# Patient Record
Sex: Female | Born: 1990 | Race: Black or African American | Hispanic: No | Marital: Married | Smoking: Never smoker
Health system: Southern US, Community
[De-identification: ages and names within clinical notes are randomized; demographics above are authoritative.]

## PROBLEM LIST (undated history)

## (undated) ENCOUNTER — Inpatient Hospital Stay (HOSPITAL_COMMUNITY): Payer: Self-pay

## (undated) DIAGNOSIS — N809 Endometriosis, unspecified: Secondary | ICD-10-CM

## (undated) DIAGNOSIS — N390 Urinary tract infection, site not specified: Secondary | ICD-10-CM

## (undated) DIAGNOSIS — O24419 Gestational diabetes mellitus in pregnancy, unspecified control: Secondary | ICD-10-CM

## (undated) DIAGNOSIS — A749 Chlamydial infection, unspecified: Secondary | ICD-10-CM

## (undated) DIAGNOSIS — N83209 Unspecified ovarian cyst, unspecified side: Secondary | ICD-10-CM

## (undated) DIAGNOSIS — Z9889 Other specified postprocedural states: Secondary | ICD-10-CM

## (undated) HISTORY — PX: ADENOIDECTOMY: SUR15

## (undated) HISTORY — PX: TONSILLECTOMY: SUR1361

---

## 2011-01-06 HISTORY — PX: ABDOMINAL EXPLORATION SURGERY: SHX538

## 2012-04-07 ENCOUNTER — Emergency Department (HOSPITAL_COMMUNITY): Payer: Medicaid Other

## 2012-04-07 ENCOUNTER — Other Ambulatory Visit: Payer: Self-pay

## 2012-04-07 ENCOUNTER — Emergency Department (HOSPITAL_COMMUNITY)
Admission: EM | Admit: 2012-04-07 | Discharge: 2012-04-07 | Disposition: A | Discharge status: Home / Self Care | Payer: Medicaid Other | Attending: Emergency Medicine | Admitting: Emergency Medicine

## 2012-04-07 ENCOUNTER — Encounter (HOSPITAL_COMMUNITY): Payer: Self-pay | Admitting: Cardiology

## 2012-04-07 DIAGNOSIS — F419 Anxiety disorder, unspecified: Secondary | ICD-10-CM

## 2012-04-07 DIAGNOSIS — R079 Chest pain, unspecified: Secondary | ICD-10-CM

## 2012-04-07 DIAGNOSIS — R0602 Shortness of breath: Secondary | ICD-10-CM | POA: Insufficient documentation

## 2012-04-07 DIAGNOSIS — F411 Generalized anxiety disorder: Secondary | ICD-10-CM | POA: Insufficient documentation

## 2012-04-07 DIAGNOSIS — Z3202 Encounter for pregnancy test, result negative: Secondary | ICD-10-CM | POA: Insufficient documentation

## 2012-04-07 DIAGNOSIS — R0789 Other chest pain: Secondary | ICD-10-CM | POA: Insufficient documentation

## 2012-04-07 LAB — RAPID URINE DRUG SCREEN, HOSP PERFORMED
Barbiturates: NOT DETECTED
Opiates: NOT DETECTED
Tetrahydrocannabinol: NOT DETECTED

## 2012-04-07 LAB — CBC WITH DIFFERENTIAL/PLATELET
Basophils Relative: 0 % (ref 0–1)
Eosinophils Absolute: 0.2 10*3/uL (ref 0.0–0.7)
Eosinophils Relative: 2 % (ref 0–5)
Lymphs Abs: 1.6 10*3/uL (ref 0.7–4.0)
MCH: 26.6 pg (ref 26.0–34.0)
MCHC: 34.2 g/dL (ref 30.0–36.0)
MCV: 77.8 fL — ABNORMAL LOW (ref 78.0–100.0)
Platelets: 311 10*3/uL (ref 150–400)
RBC: 5.08 MIL/uL (ref 3.87–5.11)
RDW: 14.4 % (ref 11.5–15.5)

## 2012-04-07 LAB — BASIC METABOLIC PANEL
Calcium: 9.5 mg/dL (ref 8.4–10.5)
GFR calc non Af Amer: >90 mL/min (ref >90)
Glucose, Bld: 86 mg/dL (ref 70–99)
Sodium: 135 mEq/L (ref 135–145)

## 2012-04-07 MED ORDER — HYDROXYZINE HCL 25 MG PO TABS: 25.0000 mg | ORAL_TABLET | Freq: Four times a day (QID) | ORAL | Status: DC | PRN

## 2012-04-07 NOTE — ED Provider Notes (Signed)
History     CSN: [id number]  Arrival date & time 04/07/12  1239   First MD Initiated Contact with Patient 04/07/12 1323      Chief Complaint  Patient presents with  . Chest Pain    (Consider location/radiation/quality/duration/timing/severity/associated sxs/prior treatment) HPI Comments: Patient comes to the ER for evaluation of chest pain. Patient reports that when she woke this morning she was experiencing tightness and squeezing in her chest. She reports that she has intermittent episodes of increasing pain that lasts for about a minute and there is back to the dural aching squeezing. She has not had any relief of the pain since she woke this morning. She feels short of breath. Pain worsens when she breathes. She has not injured herself in any way. No fever, nausea, vomiting or diarrhea. She has not had a cough.   History reviewed. No pertinent past medical history.  History reviewed. No pertinent past surgical history.  History reviewed. No pertinent family history.  History  Substance Use Topics  . Smoking status: Not on file  . Smokeless tobacco: Not on file  . Alcohol Use: Not on file    OB History   Grav Para Term Preterm Abortions TAB SAB Ect Mult Living                  Review of Systems  Constitutional: Negative for fever.  Respiratory: Positive for chest tightness and shortness of breath. Negative for cough.   Cardiovascular: Positive for chest pain.  All other systems reviewed and are negative.    Allergies  Doxycycline  Home Medications  No current outpatient prescriptions on file.  BP 119/69  Pulse 105  Temp(Src) 97.9 F (36.6 C) (Oral)  Resp 18  SpO2 99%  LMP 11/08/2011  Physical Exam  Constitutional: She is oriented to person, place, and time. She appears well-developed and well-nourished. She appears distressed.  tearful  HENT:  Head: Normocephalic and atraumatic.  Right Ear: Hearing normal.  Nose: Nose normal.  Mouth/Throat:  Oropharynx is clear and moist and mucous membranes are normal.  Eyes: Conjunctivae and EOM are normal. Pupils are equal, round, and reactive to light.  Neck: Normal range of motion. Neck supple.  Cardiovascular: Normal rate, regular rhythm, S1 normal and S2 normal.  Exam reveals no gallop and no friction rub.   No murmur heard. Pulmonary/Chest: Effort normal and breath sounds normal. No respiratory distress. She exhibits no tenderness.  Abdominal: Soft. Normal appearance and bowel sounds are normal. There is no hepatosplenomegaly. There is no tenderness. There is no rebound, no guarding, no tenderness at McBurney's point and negative Murphy's sign. No hernia.  Musculoskeletal: Normal range of motion.  Neurological: She is alert and oriented to person, place, and time. She has normal strength. No cranial nerve deficit or sensory deficit. Coordination normal. GCS eye subscore is 4. GCS verbal subscore is 5. GCS motor subscore is 6.  Skin: Skin is warm, dry and intact. No rash noted. No cyanosis.  Psychiatric: Her speech is normal and behavior is normal. Thought content normal. Her mood appears anxious.  tearful    ED Course  Procedures (including critical care time)  EKG:  Date: 04/07/2012  Rate: 105  Rhythm: sinus tachycardia  QRS Axis: normal  Intervals: normal  ST/T Wave abnormalities: normal  Conduction Disutrbances:none  Narrative Interpretation:   Old EKG Reviewed: none available    Labs Reviewed  CBC WITH DIFFERENTIAL - Abnormal; Notable for the following:    MCV 77.8 (*)  All other components within normal limits  TROPONIN I  BASIC METABOLIC PANEL  D-DIMER, QUANTITATIVE  URINE RAPID DRUG SCREEN (HOSP PERFORMED)  POCT PREGNANCY, URINE   Dg Chest 2 View  04/07/2012  *RADIOLOGY REPORT*  Clinical Data: Chest pain short of breath  CHEST - 2 VIEW  Comparison:  None.  Findings:  The heart size and mediastinal contours are within normal limits.  Both lungs are clear.  The  visualized skeletal structures are unremarkable.  IMPRESSION: No active cardiopulmonary disease.   Original Report Authenticated By: Janeece Riggers, M.D.      Diagnoses: 1. Chest pain, atypical 2. Anxiety    MDM  Patient comes to the ER for evaluation of chest pain. She reports that the pain was present upon awakening this morning. She is squeezing pain that intermittently worsens but is not related to exertion. She does not had any cardiac risk factors. Patient is very tearful at arrival, has an element of anxiety present. An EKG was performed and her troponin was negative. I do not suspect acute coronary syndrome or other cardiac etiology. Her d-dimer was also normal. Patient is now feeling better without any intervention. She will be discharged, treat for musculoskeletal chest pain, followup as needed.        Gilda Crease, MD 04/07/12 [phone]

## 2012-04-07 NOTE — ED Notes (Signed)
Pt reports a squeezing sensation in her chest that started this morning. Pt reports SOB when the pain is present. Denies any n/v with the pain. Pt is tearful at triage.

## 2012-05-10 ENCOUNTER — Encounter (HOSPITAL_COMMUNITY): Payer: Self-pay | Admitting: Emergency Medicine

## 2012-05-10 ENCOUNTER — Emergency Department (HOSPITAL_COMMUNITY)
Admission: EM | Admit: 2012-05-10 | Discharge: 2012-05-10 | Disposition: A | Discharge status: Home / Self Care | Payer: Medicaid Other | Attending: Emergency Medicine | Admitting: Emergency Medicine

## 2012-05-10 ENCOUNTER — Emergency Department (HOSPITAL_COMMUNITY): Payer: Medicaid Other

## 2012-05-10 DIAGNOSIS — Z79899 Other long term (current) drug therapy: Secondary | ICD-10-CM | POA: Insufficient documentation

## 2012-05-10 DIAGNOSIS — N898 Other specified noninflammatory disorders of vagina: Secondary | ICD-10-CM | POA: Insufficient documentation

## 2012-05-10 DIAGNOSIS — R000 Tachycardia, unspecified: Secondary | ICD-10-CM | POA: Insufficient documentation

## 2012-05-10 DIAGNOSIS — Z8742 Personal history of other diseases of the female genital tract: Secondary | ICD-10-CM | POA: Insufficient documentation

## 2012-05-10 DIAGNOSIS — R42 Dizziness and giddiness: Secondary | ICD-10-CM | POA: Insufficient documentation

## 2012-05-10 DIAGNOSIS — R0602 Shortness of breath: Secondary | ICD-10-CM | POA: Insufficient documentation

## 2012-05-10 DIAGNOSIS — R0609 Other forms of dyspnea: Secondary | ICD-10-CM | POA: Insufficient documentation

## 2012-05-10 DIAGNOSIS — R0989 Other specified symptoms and signs involving the circulatory and respiratory systems: Secondary | ICD-10-CM | POA: Insufficient documentation

## 2012-05-10 DIAGNOSIS — R079 Chest pain, unspecified: Secondary | ICD-10-CM

## 2012-05-10 DIAGNOSIS — Z3202 Encounter for pregnancy test, result negative: Secondary | ICD-10-CM | POA: Insufficient documentation

## 2012-05-10 DIAGNOSIS — R0789 Other chest pain: Secondary | ICD-10-CM | POA: Insufficient documentation

## 2012-05-10 HISTORY — DX: Endometriosis, unspecified: N80.9

## 2012-05-10 HISTORY — DX: Other specified postprocedural states: Z98.890

## 2012-05-10 HISTORY — DX: Unspecified ovarian cyst, unspecified side: N83.209

## 2012-05-10 LAB — CBC WITH DIFFERENTIAL/PLATELET
Lymphocytes Relative: 33 % (ref 12–46)
Lymphs Abs: 2.2 10*3/uL (ref 0.7–4.0)
MCV: 79 fL (ref 78.0–100.0)
Neutrophils Relative %: 57 % (ref 43–77)
Platelets: 368 10*3/uL (ref 150–400)
RBC: 5.01 MIL/uL (ref 3.87–5.11)
WBC: 6.6 10*3/uL (ref 4.0–10.5)

## 2012-05-10 LAB — URINALYSIS, ROUTINE W REFLEX MICROSCOPIC
Glucose, UA: NEGATIVE mg/dL
Leukocytes, UA: NEGATIVE
pH: 6 (ref 5.0–8.0)

## 2012-05-10 LAB — POCT PREGNANCY, URINE: Preg Test, Ur: NEGATIVE

## 2012-05-10 MED ORDER — ONDANSETRON HCL 4 MG/2ML IJ SOLN
INTRAMUSCULAR | Status: AC
  Filled 2012-05-10: qty 2

## 2012-05-10 MED ORDER — ONDANSETRON 4 MG PO TBDP: 4.0000 mg | ORAL_TABLET | Freq: Three times a day (TID) | ORAL | Status: DC | PRN

## 2012-05-10 MED ORDER — LORAZEPAM 2 MG/ML IJ SOLN
1.0000 mg | Freq: Once | INTRAMUSCULAR | Status: AC
  Administered 2012-05-10: 1 mg via INTRAVENOUS
  Filled 2012-05-10: qty 1

## 2012-05-10 MED ORDER — SODIUM CHLORIDE 0.9 % IV BOLUS (SEPSIS)
1000.0000 mL | Freq: Once | INTRAVENOUS | Status: AC
  Administered 2012-05-10: 1000 mL via INTRAVENOUS

## 2012-05-10 MED ORDER — HYDROCODONE-ACETAMINOPHEN 5-325 MG PO TABS: 1.0000 | ORAL_TABLET | Freq: Three times a day (TID) | ORAL | Status: DC | PRN

## 2012-05-10 MED ORDER — HYDROCODONE-ACETAMINOPHEN 5-325 MG PO TABS
1.0000 | ORAL_TABLET | Freq: Once | ORAL | Status: AC
  Administered 2012-05-10: 1 via ORAL
  Filled 2012-05-10: qty 1

## 2012-05-10 MED ORDER — KETOROLAC TROMETHAMINE 30 MG/ML IJ SOLN
30.0000 mg | Freq: Once | INTRAMUSCULAR | Status: AC
  Administered 2012-05-10: 30 mg via INTRAVENOUS
  Filled 2012-05-10: qty 1

## 2012-05-10 MED ORDER — ONDANSETRON HCL 4 MG/2ML IJ SOLN
4.0000 mg | Freq: Once | INTRAMUSCULAR | Status: AC
  Administered 2012-05-10: 4 mg via INTRAVENOUS

## 2012-05-10 NOTE — ED Notes (Signed)
Patient still reports chest pain that is making her unable to relax.  Also having nausea.  Dr. Jeraldine Loots notified.

## 2012-05-10 NOTE — ED Notes (Signed)
Patient with chest pain she rates as an 8, describes pain as an ache and pressure.  Associated symptom is dizziness.  Patient denies, fever, chills, coughing, nausea, vomiting, or weakness.  Pain radiates to patients back and makes it difficult for her to breathe.  Patient reports being seen here about a month ago for same symptoms and was diagnoed with panic attacks and anxiety.  Patient is tearful.  No history of respiratory or cardiac disorders.

## 2012-05-10 NOTE — ED Notes (Signed)
Notified pt of pending ua.  Pt asked for tampon.  Explained to pt that the hospital did not carry those.  Pt stated that after she found someone with one she would then provide a ua.

## 2012-05-10 NOTE — ED Provider Notes (Signed)
History     CSN: [id number]  Arrival date & time 05/10/12  4098   First MD Initiated Contact with Patient 05/10/12 1940      Chief Complaint  Patient presents with  . Chest Pain     HPI  The patient presents with concern chest pain, dyspnea.  Symptoms began today, upon awakening.  Since onset the pain anteriorly, nonradiating, sharp with associated dyspnea, dizziness. She notes that she went to bed last night in her usual state of affairs, but does endorse significant distress. The patient was seen here one month ago for similar complaints. Since that evaluation she has been generally well. Today, no clear alleviating or exacerbating factors, though symptoms are very uncomfortable appearing.  Past Medical History  Diagnosis Date  . H/O abdominal surgery   . Endometriosis   . Other and unspecified ovarian cysts     Past Surgical History  Procedure Laterality Date  . Tonsillectomy      No family history on file.  History  Substance Use Topics  . Smoking status: Never Smoker   . Smokeless tobacco: Not on file  . Alcohol Use: No    OB History   Grav Para Term Preterm Abortions TAB SAB Ect Mult Living                  Review of Systems  Constitutional: Negative for fever and chills.  HENT: Negative.   Respiratory: Positive for chest tightness and shortness of breath. Negative for cough and wheezing.   Cardiovascular: Positive for chest pain. Negative for palpitations and leg swelling.  Gastrointestinal: Negative for abdominal pain and abdominal distention.  Genitourinary: Positive for vaginal bleeding. Negative for dysuria, flank pain, vaginal discharge, vaginal pain and pelvic pain.  Musculoskeletal: Negative for back pain.  Hematological: Does not bruise/bleed easily.    Allergies  Doxycycline  Home Medications   Current Outpatient Rx  Name  Route  Sig  Dispense  Refill  . acetaminophen (TYLENOL) 500 MG tablet   Oral   Take 500 mg by mouth every 6  (six) hours as needed for pain.         . cholecalciferol (VITAMIN D) 1000 UNITS tablet   Oral   Take 1,000 Units by mouth daily.         Marland Kitchen HYDROcodone-acetaminophen (VICODIN) 5-500 MG per tablet   Oral   Take 1 tablet by mouth every 6 (six) hours as needed for pain.         Marland Kitchen estradiol cypionate (DEPO-ESTRADIOL) 5 MG/ML injection   Intramuscular   Inject into the muscle every 3 (three) months.           BP 125/73  Pulse 111  Temp(Src) 99.2 F (37.3 C) (Oral)  Resp 16  Wt 115 lb (52.164 kg)  SpO2 100%  LMP 05/01/2012  Physical Exam  Constitutional: She is oriented to person, place, and time. She appears well-developed and well-nourished. She appears distressed.  tearful  HENT:  Head: Normocephalic and atraumatic.  Right Ear: Hearing normal.  Nose: Nose normal.  Mouth/Throat: Mucous membranes are normal.  Eyes: Conjunctivae and EOM are normal. Right eye exhibits no discharge. Left eye exhibits no discharge. No scleral icterus.  Neck: No tracheal deviation present.  Cardiovascular: Regular rhythm, S1 normal and S2 normal.  Tachycardia present.  Exam reveals no gallop and no friction rub.   No murmur heard. Pulmonary/Chest: Effort normal and breath sounds normal. No stridor. No respiratory distress. She exhibits tenderness.  Abdominal: Soft. Normal appearance. There is no hepatosplenomegaly. There is no tenderness. There is no rebound, no guarding, no tenderness at McBurney's point and negative Murphy's sign. No hernia.  Musculoskeletal: Normal range of motion.  Neurological: She is alert and oriented to person, place, and time. She has normal strength. No cranial nerve deficit or sensory deficit. Coordination normal. GCS eye subscore is 4. GCS verbal subscore is 5. GCS motor subscore is 6.  Skin: Skin is warm, dry and intact. No rash noted. No cyanosis.  Psychiatric: Her speech is normal and behavior is normal. Thought content normal. Her mood appears anxious.    tearful    ED Course  Procedures (including critical care time)  Labs Reviewed  CBC WITH DIFFERENTIAL  URINALYSIS, ROUTINE W REFLEX MICROSCOPIC   No results found.   No diagnosis found.  Pulse ox 99% room air normal Cardiac 110 sinus tach abnormal    Date: 05/10/2012  Rate: 118  Rhythm: sinus tachycardia  QRS Axis: normal  Intervals: normal  ST/T Wave abnormalities: normal  Conduction Disutrbances:none  Narrative Interpretation:   Old EKG Reviewed: none available ABNORMAL  A review of the visit one month ago demonstrates a very similar presentation, though without ROS positive for vaginal bleeding.  W/U a that time included las, dimer (all reassuring.)  Update:Patient had what appeared to be a significantly stressful verbal altercation with a man.  10:28 PM Labs unremarkable.     MDM  This young female presents with chest pain.  She has no risk factors for ACS, PE.  Additionally, the patient was seen here one month ago for similar complaints, with an unremarkable evaluation. Socially, notably, the patient is getting married in 2 weeks.  During her emergency room course tonight she had a verbal altercation with another individual. The patient's ECG, labs, vital signs are all unremarkable, and the tenderness to palpation about the anterior chest is suggestive of musculoskeletal etiology.  Absent any risk factors, evidence of distress, recurrence, she is appropriate for discharge with outpatient management from this episode of chest pain likely due to inflammation, though with consideration of stress as contributory.        Gerhard Munch, MD 05/10/12 2229

## 2012-06-10 DIAGNOSIS — Z79899 Other long term (current) drug therapy: Secondary | ICD-10-CM | POA: Insufficient documentation

## 2012-06-10 DIAGNOSIS — R109 Unspecified abdominal pain: Secondary | ICD-10-CM | POA: Insufficient documentation

## 2012-06-10 DIAGNOSIS — R112 Nausea with vomiting, unspecified: Secondary | ICD-10-CM | POA: Insufficient documentation

## 2012-06-10 DIAGNOSIS — Z3202 Encounter for pregnancy test, result negative: Secondary | ICD-10-CM | POA: Insufficient documentation

## 2012-06-10 DIAGNOSIS — Z8742 Personal history of other diseases of the female genital tract: Secondary | ICD-10-CM | POA: Insufficient documentation

## 2012-06-11 ENCOUNTER — Emergency Department (HOSPITAL_BASED_OUTPATIENT_CLINIC_OR_DEPARTMENT_OTHER)
Admission: EM | Admit: 2012-06-11 | Discharge: 2012-06-11 | Disposition: A | Discharge status: Home / Self Care | Payer: Medicaid Other | Attending: Emergency Medicine | Admitting: Emergency Medicine

## 2012-06-11 DIAGNOSIS — R112 Nausea with vomiting, unspecified: Secondary | ICD-10-CM

## 2012-06-11 LAB — WET PREP, GENITAL
Clue Cells Wet Prep HPF POC: NONE SEEN
Trich, Wet Prep: NONE SEEN

## 2012-06-11 LAB — URINALYSIS, ROUTINE W REFLEX MICROSCOPIC
Bilirubin Urine: NEGATIVE
Hgb urine dipstick: NEGATIVE
Protein, ur: NEGATIVE mg/dL
Specific Gravity, Urine: 1.028 (ref 1.005–1.030)
Urobilinogen, UA: 1 mg/dL (ref 0.0–1.0)

## 2012-06-11 MED ORDER — ONDANSETRON 4 MG PO TBDP
4.0000 mg | ORAL_TABLET | Freq: Once | ORAL | Status: AC
  Administered 2012-06-11: 4 mg via ORAL
  Filled 2012-06-11: qty 1

## 2012-06-11 MED ORDER — ONDANSETRON 8 MG PO TBDP: 8.0000 mg | ORAL_TABLET | Freq: Three times a day (TID) | ORAL | Status: DC | PRN

## 2012-06-11 NOTE — ED Provider Notes (Addendum)
History     CSN: [id number]  Arrival date & time 06/10/12  2353   First MD Initiated Contact with Patient 06/11/12 0032      Chief Complaint  Patient presents with  . Nausea and Vomiting     (Consider location/radiation/quality/duration/timing/severity/associated sxs/prior treatment) HPI This is a 22 year old female with a one-week history of nausea and vomiting. The symptoms are moderate. She has not taken any medications to treat this. This has been accompanied by hot flashes and lower abdominal cramping but no diarrhea or vaginal discharge. She states she is about a week late for her period but has had spotting.  Past Medical History  Diagnosis Date  . H/O abdominal surgery   . Endometriosis   . Other and unspecified ovarian cysts     Past Surgical History  Procedure Laterality Date  . Tonsillectomy      No family history on file.  History  Substance Use Topics  . Smoking status: Never Smoker   . Smokeless tobacco: Not on file  . Alcohol Use: No    OB History   Grav Para Term Preterm Abortions TAB SAB Ect Mult Living                  Review of Systems  All other systems reviewed and are negative.    Allergies  Doxycycline  Home Medications   Current Outpatient Rx  Name  Route  Sig  Dispense  Refill  . acetaminophen (TYLENOL) 500 MG tablet   Oral   Take 500 mg by mouth every 6 (six) hours as needed for pain.         . cholecalciferol (VITAMIN D) 1000 UNITS tablet   Oral   Take 1,000 Units by mouth daily.         Marland Kitchen estradiol cypionate (DEPO-ESTRADIOL) 5 MG/ML injection   Intramuscular   Inject into the muscle every 3 (three) months.         Marland Kitchen HYDROcodone-acetaminophen (NORCO/VICODIN) 5-325 MG per tablet   Oral   Take 1 tablet by mouth every 8 (eight) hours as needed for pain.   13 tablet   0   . ondansetron (ZOFRAN-ODT) 4 MG disintegrating tablet   Oral   Take 1 tablet (4 mg total) by mouth every 8 (eight) hours as needed for  nausea.   10 tablet   0     BP 114/74  Pulse 106  Temp(Src) 98.5 F (36.9 C) (Oral)  Resp 17  Ht 5\' 1"  (1.549 m)  Wt 125 lb (56.7 kg)  BMI 23.63 kg/m2  SpO2 100%  LMP 05/07/2012  Physical Exam General: Well-developed, well-nourished female in no acute distress; appearance consistent with age of record HENT: normocephalic, atraumatic Eyes: pupils equal round and reactive to light; extraocular muscles intact Neck: supple Heart: regular rate and rhythm Lungs: clear to auscultation bilaterally Abdomen: soft; nondistended; nontender; no masses or hepatosplenomegaly; bowel sounds present GU: No CVA tenderness; no vaginal discharge; slight cervical spotting; no cervical motion tenderness; mild left adnexal tenderness Extremities: No deformity; full range of motion; pulses normal; no edema Neurologic: Awake, alert and oriented; motor function intact in all extremities and symmetric; no facial droop Skin: Warm and dry Psychiatric: Normal mood and affect    ED Course  Procedures (including critical care time)     MDM   Nursing notes and vitals signs, including pulse oximetry, reviewed.  Summary of this visit's results, reviewed by myself:  Labs:  Results for orders placed during  the hospital encounter of 06/11/12 (from the past 24 hour(s))  URINALYSIS, ROUTINE W REFLEX MICROSCOPIC     Status: None   Collection Time    06/11/12 12:12 AM      Result Value Range   Color, Urine YELLOW  YELLOW   APPearance CLEAR  CLEAR   Specific Gravity, Urine 1.028  1.005 - 1.030   pH 6.0  5.0 - 8.0   Glucose, UA NEGATIVE  NEGATIVE mg/dL   Hgb urine dipstick NEGATIVE  NEGATIVE   Bilirubin Urine NEGATIVE  NEGATIVE   Ketones, ur NEGATIVE  NEGATIVE mg/dL   Protein, ur NEGATIVE  NEGATIVE mg/dL   Urobilinogen, UA 1.0  0.0 - 1.0 mg/dL   Nitrite NEGATIVE  NEGATIVE   Leukocytes, UA NEGATIVE  NEGATIVE  PREGNANCY, URINE     Status: None   Collection Time    06/11/12 12:12 AM      Result  Value Range   Preg Test, Ur NEGATIVE  NEGATIVE  WET PREP, GENITAL     Status: Abnormal   Collection Time    06/11/12 12:44 AM      Result Value Range   Yeast Wet Prep HPF POC NONE SEEN  NONE SEEN   Trich, Wet Prep NONE SEEN  NONE SEEN   Clue Cells Wet Prep HPF POC NONE SEEN  NONE SEEN   WBC, Wet Prep HPF POC FEW (*) NONE SEEN   12:54 AM Patient advised of lab findings. Patient is not satisfied with urine pregnancy test and is requesting a blood test. She was advised that this was not indicated.      Hanley Seamen, MD 06/11/12 0050  Hanley Seamen, MD 06/11/12 9562

## 2012-06-11 NOTE — ED Notes (Signed)
Pt. Reports nausea and cramping in the lower abd.  Pt. Reports she is 7 days late for her period.  Pt. Reports she spotted like she was having a period on Friday but no period per Pt.

## 2012-06-12 ENCOUNTER — Inpatient Hospital Stay (HOSPITAL_COMMUNITY)
Admission: AD | Admit: 2012-06-12 | Discharge: 2012-06-12 | Disposition: A | Discharge status: Home / Self Care | Payer: Medicaid Other | Source: Ambulatory Visit | Attending: Obstetrics & Gynecology | Admitting: Obstetrics & Gynecology

## 2012-06-12 ENCOUNTER — Encounter (HOSPITAL_COMMUNITY): Payer: Self-pay | Admitting: *Deleted

## 2012-06-12 DIAGNOSIS — N911 Secondary amenorrhea: Secondary | ICD-10-CM

## 2012-06-12 DIAGNOSIS — N912 Amenorrhea, unspecified: Secondary | ICD-10-CM | POA: Insufficient documentation

## 2012-06-12 DIAGNOSIS — R109 Unspecified abdominal pain: Secondary | ICD-10-CM | POA: Insufficient documentation

## 2012-06-12 DIAGNOSIS — Z3202 Encounter for pregnancy test, result negative: Secondary | ICD-10-CM | POA: Insufficient documentation

## 2012-06-12 LAB — URINALYSIS, ROUTINE W REFLEX MICROSCOPIC
Glucose, UA: NEGATIVE mg/dL
Ketones, ur: NEGATIVE mg/dL
Protein, ur: NEGATIVE mg/dL

## 2012-06-12 MED ORDER — IBUPROFEN 800 MG PO TABS
800.0000 mg | ORAL_TABLET | Freq: Once | ORAL | Status: AC
  Administered 2012-06-12: 800 mg via ORAL
  Filled 2012-06-12: qty 1

## 2012-06-12 NOTE — MAU Provider Note (Signed)
History     CSN: [id number]  Arrival date and time: 06/12/12 2013   First Provider Initiated Contact with Patient 06/12/12 2221      Chief Complaint  Patient presents with  . Possible Pregnancy  . Nausea   HPI  Sara Park is a 22 y.o. G2P1011 who presents tonight because her period is 9 days late and she is having some cramping and nausea. She was seen at [facility] last night, and had a negative UPT. She is requesting a blood test today. She has not tried any medication for the cramping at this time.   Past Medical History  Diagnosis Date  . H/O abdominal surgery   . Endometriosis   . Other and unspecified ovarian cysts     Past Surgical History  Procedure Laterality Date  . Tonsillectomy    . Abdominal exploration surgery      Family History  Problem Relation Age of Onset  . Hypertension Mother   . Diabetes Mother   . Hypertension Father   . Diabetes Father     History  Substance Use Topics  . Smoking status: Never Smoker   . Smokeless tobacco: Not on file  . Alcohol Use: No    Allergies:  Allergies  Allergen Reactions  . Doxycycline     spasms    Prescriptions prior to admission  Medication Sig Dispense Refill  . acetaminophen (TYLENOL) 500 MG tablet Take 500 mg by mouth every 6 (six) hours as needed for pain.      . cholecalciferol (VITAMIN D) 1000 UNITS tablet Take 1,000 Units by mouth daily.      Marland Kitchen estradiol cypionate (DEPO-ESTRADIOL) 5 MG/ML injection Inject into the muscle every 3 (three) months.      Marland Kitchen HYDROcodone-acetaminophen (NORCO/VICODIN) 5-325 MG per tablet Take 1 tablet by mouth every 8 (eight) hours as needed for pain.  13 tablet  0  . ondansetron (ZOFRAN ODT) 8 MG disintegrating tablet Take 1 tablet (8 mg total) by mouth every 8 (eight) hours as needed.  10 tablet  0  . ondansetron (ZOFRAN-ODT) 4 MG disintegrating tablet Take 1 tablet (4 mg total) by mouth every 8 (eight) hours as needed for nausea.  10 tablet  0    Review of Systems   Constitutional: Negative for fever.  Eyes: Negative for blurred vision.  Respiratory: Negative for shortness of breath.   Cardiovascular: Negative for chest pain.  Gastrointestinal: Positive for nausea and abdominal pain ("cramps"). Negative for vomiting, diarrhea and constipation.  Genitourinary: Negative for dysuria, urgency and frequency.  Musculoskeletal: Negative for myalgias.  Neurological: Negative for dizziness and headaches.   Physical Exam   Blood pressure 121/64, pulse 108, temperature 98.5 F (36.9 C), temperature source Oral, resp. rate 20, height 5\' 1"  (1.549 m), weight 56.246 kg (124 lb), last menstrual period 05/07/2012, SpO2 100.00%.  Physical Exam  Nursing note and vitals reviewed. Constitutional: She is oriented to person, place, and time. She appears well-developed and well-nourished. No distress.  Cardiovascular: Normal rate.   Respiratory: Effort normal.  GI: Soft.  Neurological: She is alert and oriented to person, place, and time.  Skin: Skin is warm and dry.  Psychiatric: She has a normal mood and affect.    MAU Course  Procedures  Results for orders placed during the hospital encounter of 06/12/12 (from the past 24 hour(s))  HCG, QUANTITATIVE, PREGNANCY     Status: None   Collection Time    06/12/12  9:05 PM  Result Value Range   hCG, Beta Chain, Quant, S <1  <5 mIU/mL    Assessment and Plan   1. Amenorrhea, secondary    Discussed that period will likely start soon esp with the cramping Will FU with GYN is no menses Discussed common causes of amenorrhea.  Tawnya Crook 06/12/2012, 10:26 PM

## 2012-06-12 NOTE — MAU Note (Addendum)
Nausea, period is 9 days late, cramping. Dizziness. Has had multiple negative preg test.

## 2012-06-12 NOTE — MAU Provider Note (Signed)
Attestation of Attending Supervision of Advanced Practitioner (CNM/NP): Evaluation and management procedures were performed by the Advanced Practitioner under my supervision and collaboration.  I have reviewed the Advanced Practitioner's note and chart, and I agree with the management and plan.  HARRAWAY-SMITH, Nero Sawatzky 11:38 PM     

## 2012-06-13 LAB — GC/CHLAMYDIA PROBE AMP: CT Probe RNA: NEGATIVE

## 2012-07-19 ENCOUNTER — Encounter: Payer: Self-pay | Admitting: Obstetrics

## 2012-07-19 ENCOUNTER — Encounter (INDEPENDENT_AMBULATORY_CARE_PROVIDER_SITE_OTHER): Payer: Medicaid Other | Admitting: Obstetrics

## 2012-07-19 ENCOUNTER — Ambulatory Visit (INDEPENDENT_AMBULATORY_CARE_PROVIDER_SITE_OTHER): Payer: Medicaid Other | Admitting: Obstetrics

## 2012-07-19 VITALS — BP 109/66 | HR 85 | Ht 61.0 in | Wt 128.0 lb

## 2012-07-19 DIAGNOSIS — Z3202 Encounter for pregnancy test, result negative: Secondary | ICD-10-CM

## 2012-07-19 DIAGNOSIS — O039 Complete or unspecified spontaneous abortion without complication: Secondary | ICD-10-CM

## 2012-07-19 LAB — POCT URINALYSIS DIPSTICK
Bilirubin, UA: NORMAL
Blood, UA: NEGATIVE
Glucose, UA: NEGATIVE
Nitrite, UA: NEGATIVE

## 2012-07-19 LAB — POCT URINE PREGNANCY: Preg Test, Ur: NEGATIVE

## 2012-07-19 NOTE — Progress Notes (Signed)
Subjective:     Sara Park is a 22 y.o. female here for a routine exam.  Current complaints: pt here today to follow up to a miscarriage. Pt states she had a positive home pregnancy test on 06-22-12. Pt states she started bleeding on 06-24-12. Pt states about 10 days later she had a heavy menstrual cycle. Pt states she is having shooting pain on her left side. Pt states she has been experiencing it for about 1 week.   Personal health questionnaire reviewed: yes.   Gynecologic History Patient's last menstrual period was 07/06/2012. Contraception: none Last Pap: 11/2011. Results were: abnormal Last mammogram: n/a. Results were: n/a  Obstetric History OB History   Grav Para Term Preterm Abortions TAB SAB Ect Mult Living   2 1 1  1 1    1      # Outc Date GA Lbr Len/2nd Wgt Sex Del Anes PTL Lv   1 TAB            2 TRM                The following portions of the patient's history were reviewed and updated as appropriate: allergies, current medications, past family history, past medical history, past social history, past surgical history and problem list.  Review of Systems Pertinent items are noted in HPI.    Objective:    General appearance: alert and no distress Abdomen: soft, non-tender; bowel sounds normal; no masses,  no organomegaly Pelvic: cervix normal in appearance, external genitalia normal, no adnexal masses or tenderness, no cervical motion tenderness, uterus normal size, shape, and consistency and vagina normal without discharge    Assessment:    Healthy female exam.   S/P SAB, complete.  Doing well.   Plan:    Contraception: none. F/U prn.

## 2012-08-23 ENCOUNTER — Other Ambulatory Visit (INDEPENDENT_AMBULATORY_CARE_PROVIDER_SITE_OTHER): Payer: Medicaid Other | Admitting: *Deleted

## 2012-08-23 VITALS — BP 117/71 | HR 95 | Temp 98.8°F | Ht 61.0 in | Wt 131.0 lb

## 2012-08-23 DIAGNOSIS — Z3201 Encounter for pregnancy test, result positive: Secondary | ICD-10-CM

## 2012-08-23 LAB — POCT URINE PREGNANCY: Preg Test, Ur: POSITIVE

## 2012-08-23 NOTE — Progress Notes (Signed)
Pt in office today for a pregnancy confirmation. Pt states she had a positive home pregnancy test on 08-21-12. Pregnancy test in office is positive. By LMP the EDD is May 02, 2013. Pt instructed to schedule NOB appointment.

## 2012-09-01 ENCOUNTER — Encounter (HOSPITAL_COMMUNITY): Payer: Self-pay

## 2012-09-01 ENCOUNTER — Inpatient Hospital Stay (HOSPITAL_COMMUNITY)
Admission: AD | Admit: 2012-09-01 | Discharge: 2012-09-01 | Disposition: A | Discharge status: Home / Self Care | Payer: Medicaid Other | Source: Ambulatory Visit | Attending: Obstetrics & Gynecology | Admitting: Obstetrics & Gynecology

## 2012-09-01 ENCOUNTER — Inpatient Hospital Stay (HOSPITAL_COMMUNITY): Payer: Medicaid Other

## 2012-09-01 DIAGNOSIS — O99891 Other specified diseases and conditions complicating pregnancy: Secondary | ICD-10-CM | POA: Insufficient documentation

## 2012-09-01 DIAGNOSIS — O26899 Other specified pregnancy related conditions, unspecified trimester: Secondary | ICD-10-CM

## 2012-09-01 DIAGNOSIS — R1032 Left lower quadrant pain: Secondary | ICD-10-CM | POA: Insufficient documentation

## 2012-09-01 DIAGNOSIS — O210 Mild hyperemesis gravidarum: Secondary | ICD-10-CM | POA: Insufficient documentation

## 2012-09-01 HISTORY — DX: Urinary tract infection, site not specified: N39.0

## 2012-09-01 HISTORY — DX: Chlamydial infection, unspecified: A74.9

## 2012-09-01 LAB — URINALYSIS, ROUTINE W REFLEX MICROSCOPIC
Nitrite: NEGATIVE
Protein, ur: NEGATIVE mg/dL
Urobilinogen, UA: 0.2 mg/dL (ref 0.0–1.0)

## 2012-09-01 LAB — CBC WITH DIFFERENTIAL/PLATELET
Basophils Absolute: 0 10*3/uL (ref 0.0–0.1)
Basophils Relative: 0 % (ref 0–1)
Eosinophils Absolute: 0.2 10*3/uL (ref 0.0–0.7)
MCHC: 33.4 g/dL (ref 30.0–36.0)
Neutro Abs: 2.5 10*3/uL (ref 1.7–7.7)
Neutrophils Relative %: 48 % (ref 43–77)
RDW: 13.3 % (ref 11.5–15.5)

## 2012-09-01 LAB — URINE MICROSCOPIC-ADD ON

## 2012-09-01 LAB — WET PREP, GENITAL
Clue Cells Wet Prep HPF POC: NONE SEEN
Trich, Wet Prep: NONE SEEN

## 2012-09-01 LAB — POCT PREGNANCY, URINE: Preg Test, Ur: POSITIVE — AB

## 2012-09-01 MED ORDER — FAMOTIDINE 20 MG PO TABS: 20.0000 mg | ORAL_TABLET | Freq: Two times a day (BID) | ORAL | Status: DC

## 2012-09-01 MED ORDER — OXYCODONE-ACETAMINOPHEN 5-325 MG PO TABS
1.0000 | ORAL_TABLET | Freq: Once | ORAL | Status: AC
  Administered 2012-09-01: 1 via ORAL
  Filled 2012-09-01: qty 1

## 2012-09-01 MED ORDER — ONDANSETRON 8 MG PO TBDP
8.0000 mg | ORAL_TABLET | Freq: Once | ORAL | Status: AC
  Administered 2012-09-01: 8 mg via ORAL
  Filled 2012-09-01: qty 1

## 2012-09-01 MED ORDER — PROMETHAZINE HCL 25 MG PO TABS: 25.0000 mg | ORAL_TABLET | Freq: Four times a day (QID) | ORAL | Status: DC | PRN

## 2012-09-01 MED ORDER — PROMETHAZINE HCL 25 MG/ML IJ SOLN
25.0000 mg | Freq: Four times a day (QID) | INTRAMUSCULAR | Status: DC | PRN
  Administered 2012-09-01: 25 mg via INTRAMUSCULAR
  Filled 2012-09-01: qty 1

## 2012-09-01 NOTE — MAU Note (Signed)
Patient is sent from the doctor's office with c/o llq pain. Patient appears to be uncomfortable, she describes the pain as sharp, lower mid back as well. Patient states that she is having n/v daily (no rx for anti-emetic). She denies vaginal bleeding, or abnormal vaginal discharge. lmp 7/22.

## 2012-09-01 NOTE — MAU Provider Note (Signed)
History     CSN: [id number]  Arrival date and time: 09/01/12 1641   First Provider Initiated Contact with Patient 09/01/12 1712      Chief Complaint  Patient presents with  . Abdominal Pain   HPI Comments: Sara Park 22 y.o. [id number] comes to MAU today with left lower quad pain in early pregnancy. She is 5 weeks and 2 days by her LMP. She noticed the pain as sharp starting this morning at 8 am. She also complains of nausea. The pain is rated 6/10.      Patient is a 22 y.o. female presenting with abdominal pain.  Abdominal Pain The primary symptoms of the illness include abdominal pain and nausea.  Additional symptoms associated with the illness include heartburn and back pain.      Past Medical History  Diagnosis Date  . H/O abdominal surgery   . Endometriosis   . Other and unspecified ovarian cysts   . UTI (lower urinary tract infection)   . Chlamydia     Past Surgical History  Procedure Laterality Date  . Tonsillectomy    . Abdominal exploration surgery      Family History  Problem Relation Age of Onset  . Hypertension Mother   . Diabetes Mother   . Hypertension Father   . Diabetes Father     History  Substance Use Topics  . Smoking status: Never Smoker   . Smokeless tobacco: Not on file  . Alcohol Use: No    Allergies:  Allergies  Allergen Reactions  . Doxycycline     spasms    No prescriptions prior to admission    Review of Systems  Constitutional: Negative.   Respiratory: Negative.   Cardiovascular: Negative.   Gastrointestinal: Positive for heartburn, nausea and abdominal pain.  Musculoskeletal: Positive for back pain.  Neurological: Negative.   Psychiatric/Behavioral: Negative.    Physical Exam   Blood pressure 147/81, pulse 109, temperature 98.7 F (37.1 C), temperature source Oral, resp. rate 18, height 5\' 1"  (1.549 m), weight 129 lb 6 oz (58.684 kg), last menstrual period 07/26/2012, SpO2 100.00%.  Physical Exam   Constitutional: She appears well-developed and well-nourished. She appears distressed.  HENT:  Head: Normocephalic and atraumatic.  Eyes: Pupils are equal, round, and reactive to light.  Neck: Normal range of motion.  Cardiovascular: Normal rate.   Respiratory: Effort normal.  GI: Soft. She exhibits no distension and no mass. There is tenderness. There is no rebound and no guarding.  Genitourinary: Uterus normal. Vaginal discharge found.  Cervix long and closed  Musculoskeletal: Normal range of motion.  Neurological: She is alert.  Skin: Skin is warm and dry.  Psychiatric: She has a normal mood and affect.   Results for orders placed during the hospital encounter of 09/01/12 (from the past 24 hour(s))  URINALYSIS, ROUTINE W REFLEX MICROSCOPIC     Status: Abnormal   Collection Time    09/01/12  5:01 PM      Result Value Range   Color, Urine YELLOW  YELLOW   APPearance CLEAR  CLEAR   Specific Gravity, Urine >1.030 (*) 1.005 - 1.030   pH 6.0  5.0 - 8.0   Glucose, UA NEGATIVE  NEGATIVE mg/dL   Hgb urine dipstick NEGATIVE  NEGATIVE   Bilirubin Urine NEGATIVE  NEGATIVE   Ketones, ur NEGATIVE  NEGATIVE mg/dL   Protein, ur NEGATIVE  NEGATIVE mg/dL   Urobilinogen, UA 0.2  0.0 - 1.0 mg/dL   Nitrite NEGATIVE  NEGATIVE  Leukocytes, UA MODERATE (*) NEGATIVE  URINE MICROSCOPIC-ADD ON     Status: Abnormal   Collection Time    09/01/12  5:01 PM      Result Value Range   Squamous Epithelial / LPF FEW (*) RARE   WBC, UA 21-50  <3 WBC/hpf   RBC / HPF 0-2  <3 RBC/hpf   Bacteria, UA MANY (*) RARE   Urine-Other MUCOUS PRESENT    POCT PREGNANCY, URINE     Status: Abnormal   Collection Time    09/01/12  5:04 PM      Result Value Range   Preg Test, Ur POSITIVE (*) NEGATIVE  CBC WITH DIFFERENTIAL     Status: None   Collection Time    09/01/12  5:30 PM      Result Value Range   WBC 5.1  4.0 - 10.5 K/uL   RBC 4.56  3.87 - 5.11 MIL/uL   Hemoglobin 12.3  12.0 - 15.0 g/dL   HCT 16.1  09.6 -  04.5 %   MCV 80.7  78.0 - 100.0 fL   MCH 27.0  26.0 - 34.0 pg   MCHC 33.4  30.0 - 36.0 g/dL   RDW 40.9  81.1 - 91.4 %   Platelets 278  150 - 400 K/uL   Neutrophils Relative % 48  43 - 77 %   Neutro Abs 2.5  1.7 - 7.7 K/uL   Lymphocytes Relative 39  12 - 46 %   Lymphs Abs 2.0  0.7 - 4.0 K/uL   Monocytes Relative 9  3 - 12 %   Monocytes Absolute 0.5  0.1 - 1.0 K/uL   Eosinophils Relative 4  0 - 5 %   Eosinophils Absolute 0.2  0.0 - 0.7 K/uL   Basophils Relative 0  0 - 1 %   Basophils Absolute 0.0  0.0 - 0.1 K/uL  HCG, QUANTITATIVE, PREGNANCY     Status: Abnormal   Collection Time    09/01/12  5:30 PM      Result Value Range   hCG, Beta Chain, Quant, S 9649 (*) <5 mIU/mL  ABO/RH     Status: None   Collection Time    09/01/12  5:30 PM      Result Value Range   ABO/RH(D) B POS    WET PREP, GENITAL     Status: Abnormal   Collection Time    09/01/12  6:40 PM      Result Value Range   Yeast Wet Prep HPF POC NONE SEEN  NONE SEEN   Trich, Wet Prep NONE SEEN  NONE SEEN   Clue Cells Wet Prep HPF POC NONE SEEN  NONE SEEN   WBC, Wet Prep HPF POC FEW (*) NONE SEEN   Results for orders placed during the hospital encounter of 09/01/12 (from the past 24 hour(s))  URINALYSIS, ROUTINE W REFLEX MICROSCOPIC     Status: Abnormal   Collection Time    09/01/12  5:01 PM      Result Value Range   Color, Urine YELLOW  YELLOW   APPearance CLEAR  CLEAR   Specific Gravity, Urine >1.030 (*) 1.005 - 1.030   pH 6.0  5.0 - 8.0   Glucose, UA NEGATIVE  NEGATIVE mg/dL   Hgb urine dipstick NEGATIVE  NEGATIVE   Bilirubin Urine NEGATIVE  NEGATIVE   Ketones, ur NEGATIVE  NEGATIVE mg/dL   Protein, ur NEGATIVE  NEGATIVE mg/dL   Urobilinogen, UA 0.2  0.0 - 1.0 mg/dL  Nitrite NEGATIVE  NEGATIVE   Leukocytes, UA MODERATE (*) NEGATIVE  URINE MICROSCOPIC-ADD ON     Status: Abnormal   Collection Time    09/01/12  5:01 PM      Result Value Range   Squamous Epithelial / LPF FEW (*) RARE   WBC, UA 21-50  <3  WBC/hpf   RBC / HPF 0-2  <3 RBC/hpf   Bacteria, UA MANY (*) RARE   Urine-Other MUCOUS PRESENT    POCT PREGNANCY, URINE     Status: Abnormal   Collection Time    09/01/12  5:04 PM      Result Value Range   Preg Test, Ur POSITIVE (*) NEGATIVE  CBC WITH DIFFERENTIAL     Status: None   Collection Time    09/01/12  5:30 PM      Result Value Range   WBC 5.1  4.0 - 10.5 K/uL   RBC 4.56  3.87 - 5.11 MIL/uL   Hemoglobin 12.3  12.0 - 15.0 g/dL   HCT 16.1  09.6 - 04.5 %   MCV 80.7  78.0 - 100.0 fL   MCH 27.0  26.0 - 34.0 pg   MCHC 33.4  30.0 - 36.0 g/dL   RDW 40.9  81.1 - 91.4 %   Platelets 278  150 - 400 K/uL   Neutrophils Relative % 48  43 - 77 %   Neutro Abs 2.5  1.7 - 7.7 K/uL   Lymphocytes Relative 39  12 - 46 %   Lymphs Abs 2.0  0.7 - 4.0 K/uL   Monocytes Relative 9  3 - 12 %   Monocytes Absolute 0.5  0.1 - 1.0 K/uL   Eosinophils Relative 4  0 - 5 %   Eosinophils Absolute 0.2  0.0 - 0.7 K/uL   Basophils Relative 0  0 - 1 %   Basophils Absolute 0.0  0.0 - 0.1 K/uL  HCG, QUANTITATIVE, PREGNANCY     Status: Abnormal   Collection Time    09/01/12  5:30 PM      Result Value Range   hCG, Beta Chain, Quant, S 9649 (*) <5 mIU/mL  ABO/RH     Status: None   Collection Time    09/01/12  5:30 PM      Result Value Range   ABO/RH(D) B POS    WET PREP, GENITAL     Status: Abnormal   Collection Time    09/01/12  6:40 PM      Result Value Range   Yeast Wet Prep HPF POC NONE SEEN  NONE SEEN   Trich, Wet Prep NONE SEEN  NONE SEEN   Clue Cells Wet Prep HPF POC NONE SEEN  NONE SEEN   WBC, Wet Prep HPF POC FEW (*) NONE SEEN   US Ob Comp Less 14 Wks  09/01/2012   *RADIOLOGY REPORT*  Clinical Data: Pelvic and left lower quadrant pain, pregnant, quantitative beta HCG 9649  OBSTETRIC <14 WK [place] AND TRANSVAGINAL OB US  Technique:  Both transabdominal and transvaginal ultrasound examinations were performed for complete evaluation of the gestation as well as the maternal uterus, adnexal regions, and  pelvic cul-de-sac.  Transvaginal technique was performed to assess early pregnancy.  Comparison:  None  Intrauterine gestational sac:  Visualized/normal in shape. Yolk sac: Present Embryo: Absent Cardiac Activity: N/A Heart Rate: N/A bpm  MSD: 9.9 mm      5 w   5 d    EGA  [place] EDC: 04/29/2013  Maternal uterus/adnexae: Small subchorionic hemorrhage. Trace free pelvic fluid. Ovaries unremarkable. No adnexal masses.  IMPRESSION: Gestational sac identified within the uterus containing a yolk sac but no fetal pole is visualized. Findings are consistent with an intrauterine pregnancy though unable to establish viability due to nonvisualization of a fetal pole. Consider follow-up non emergent ultrasound imaging in 14 days to evaluate fetal viability.   Original Report Authenticated By: Ulyses Southward, M.D.   US Ob Transvaginal  09/01/2012   *RADIOLOGY REPORT*  Clinical Data: Pelvic and left lower quadrant pain, pregnant, quantitative beta HCG 9649  OBSTETRIC <14 WK [place] AND TRANSVAGINAL OB US  Technique:  Both transabdominal and transvaginal ultrasound examinations were performed for complete evaluation of the gestation as well as the maternal uterus, adnexal regions, and pelvic cul-de-sac.  Transvaginal technique was performed to assess early pregnancy.  Comparison:  None  Intrauterine gestational sac:  Visualized/normal in shape. Yolk sac: Present Embryo: Absent Cardiac Activity: N/A Heart Rate: N/A bpm  MSD: 9.9 mm      5 w   5 d    EGA         [place] EDC: 04/29/2013  Maternal uterus/adnexae: Small subchorionic hemorrhage. Trace free pelvic fluid. Ovaries unremarkable. No adnexal masses.  IMPRESSION: Gestational sac identified within the uterus containing a yolk sac but no fetal pole is visualized. Findings are consistent with an intrauterine pregnancy though unable to establish viability due to nonvisualization of a fetal pole. Consider follow-up non emergent ultrasound imaging in 14 days to evaluate fetal viability.    Original Report Authenticated By: Ulyses Southward, M.D.    MAU Course  Procedures  MDM: Will do U/S, CBC, Quant, ABORh, wet prep, GC/ Chlamydia Zofran not effective will use phenergan. One tablet of percocet Dr Gaynell Face called and agreed with plan   Assessment and Plan  A: Pain in early pregnancy likely related to nausea and reflux  P: Above tests are done Pt will return in  48 hours x 3  For Beta HCG Phenergan 25 mg po q 8 hours for nausea Pepcid 20 mg BID Advised to begin prenatal care with Dr Clent Ridges, Rubbie Battiest 09/01/2012, 7:24 PM

## 2012-09-02 LAB — URINE CULTURE

## 2012-09-02 LAB — GC/CHLAMYDIA PROBE AMP: GC Probe RNA: NEGATIVE

## 2012-09-03 ENCOUNTER — Inpatient Hospital Stay (HOSPITAL_COMMUNITY)
Admission: AD | Admit: 2012-09-03 | Discharge: 2012-09-03 | Disposition: A | Discharge status: Home / Self Care | Payer: Medicaid Other | Source: Ambulatory Visit | Attending: Obstetrics | Admitting: Obstetrics

## 2012-09-03 ENCOUNTER — Encounter (HOSPITAL_COMMUNITY): Payer: Self-pay | Admitting: *Deleted

## 2012-09-03 DIAGNOSIS — O0281 Inappropriate change in quantitative human chorionic gonadotropin (hCG) in early pregnancy: Secondary | ICD-10-CM

## 2012-09-03 DIAGNOSIS — O200 Threatened abortion: Secondary | ICD-10-CM | POA: Insufficient documentation

## 2012-09-03 NOTE — MAU Provider Note (Signed)
History     CSN: [id number]  Arrival date and time: 09/03/12 1551   First Provider Initiated Contact with Patient 09/03/12 1757      Chief Complaint  Patient presents with  . Repeat BHCG    . cx twinging since [place] 2 days ago    HPI Sara Park 22 y.o. [redacted]w[redacted]d   Was here 2 days ago and evaluated for early pregnancy.  She come today for repeat quant.  Plan was to have BHCG levels every 48 hours x 3.  Reports that she has felt twinges in her cervix and has seen brown discharge since having the transvaginal ultrasound.  Had previous miscarriage in June 2014  OB History   Grav Para Term Preterm Abortions TAB SAB Ect Mult Living   4 1 1  2 1 1   1       Past Medical History  Diagnosis Date  . H/O abdominal surgery   . Endometriosis   . Other and unspecified ovarian cysts   . UTI (lower urinary tract infection)   . Chlamydia     Past Surgical History  Procedure Laterality Date  . Tonsillectomy    . Abdominal exploration surgery      Family History  Problem Relation Age of Onset  . Hypertension Mother   . Diabetes Mother   . Hypertension Father   . Diabetes Father     History  Substance Use Topics  . Smoking status: Never Smoker   . Smokeless tobacco: Not on file  . Alcohol Use: No    Allergies:  Allergies  Allergen Reactions  . Doxycycline     spasms    Prescriptions prior to admission  Medication Sig Dispense Refill  . Prenatal Vit-Fe Fumarate-FA (PRENATAL MULTIVITAMIN) TABS tablet Take 1 tablet by mouth daily at 12 noon.      . promethazine (PHENERGAN) 25 MG tablet Take 1 tablet (25 mg total) by mouth every 6 (six) hours as needed for nausea.  30 tablet  1  . famotidine (PEPCID) 20 MG tablet Take 1 tablet (20 mg total) by mouth 2 (two) times daily.  60 tablet  0    Review of Systems  Constitutional: Negative for fever.  Gastrointestinal: Negative for nausea and vomiting.  Genitourinary:       No vaginal discharge. Brown vaginal bleeding. No  dysuria. Pain in the cervix   Physical Exam   Blood pressure 106/63, pulse 84, temperature 98.6 F (37 C), temperature source Oral, resp. rate 16, height 5\' 1"  (1.549 m), weight 133 lb 6 oz (60.499 kg), last menstrual period 07/26/2012.  Physical Exam  Nursing note and vitals reviewed. Constitutional: She is oriented to person, place, and time. She appears well-developed and well-nourished.  HENT:  Head: Normocephalic.  Eyes: EOM are normal.  Neck: Neck supple.  GI: Soft. There is no tenderness.  Genitourinary:  Speculum exam: Vagina - Small amount of creamy discharge, no odor, no bleeding, no brown discharge seen Cervix - No active bleeding, appears closed   Musculoskeletal: Normal range of motion.  Neurological: She is alert and oriented to person, place, and time.  Skin: Skin is warm and dry.  Psychiatric: She has a normal mood and affect.    MAU Course  Procedures Results for Sara, Park (MRN [id number]) as of 09/03/2012 17:55  Ref. Range 09/01/2012 17:04 09/01/2012 17:30 09/01/2012 18:11 09/01/2012 18:40 09/03/2012 16:04  hCG, Beta Chain, Quant, S Latest Range: <5 mIU/mL  9649 (H)   11643 (H)  MDM  Assessment and Plan  Inadequate rise in quant Threatened miscarriage  Plan Return in 48 hours for repeat quant Increased vaginal bleeding or increased cramping would be consistent with slowly rising quant.  Miscarriage is possible.  Sara Park 09/03/2012, 6:09 PM

## 2012-09-03 NOTE — MAU Note (Signed)
Pt states here for repeat labwork, does have twinges in her cervix since having transvaginal ultrasound done two days ago. Also notes brown vaginal discharge that she did not have before.

## 2012-09-05 ENCOUNTER — Inpatient Hospital Stay (HOSPITAL_COMMUNITY)
Admission: AD | Admit: 2012-09-05 | Discharge: 2012-09-05 | Disposition: A | Discharge status: Home / Self Care | Payer: Medicaid Other | Source: Ambulatory Visit | Attending: Obstetrics & Gynecology | Admitting: Obstetrics & Gynecology

## 2012-09-05 DIAGNOSIS — R109 Unspecified abdominal pain: Secondary | ICD-10-CM | POA: Insufficient documentation

## 2012-09-05 DIAGNOSIS — O99891 Other specified diseases and conditions complicating pregnancy: Secondary | ICD-10-CM | POA: Insufficient documentation

## 2012-09-05 DIAGNOSIS — O26899 Other specified pregnancy related conditions, unspecified trimester: Secondary | ICD-10-CM

## 2012-09-05 DIAGNOSIS — O9989 Other specified diseases and conditions complicating pregnancy, childbirth and the puerperium: Secondary | ICD-10-CM

## 2012-09-05 NOTE — MAU Note (Signed)
Here for follow up bhcg.  Had some bad cramping last night, less this morning,  Gone now.no bleeding.

## 2012-09-05 NOTE — MAU Provider Note (Signed)
[name] is a 22 y.o. [phone] at [redacted]w[redacted]d who presents to MAU today for follow-up quant hCG. The patient had quant hCG of [address]81[address] 2 days ago.   LMP 07/26/2012 GENERAL: Well-developed, well-nourished female in no acute distress.  HEENT: Normocephalic, atraumatic.   LUNGS: Effort normal HEART: Regular rate  SKIN: Warm, dry and without erythema PSYCH: Normal mood and affect  Results for orders placed during the hospital encounter of 09/05/12 (from the past 24 hour(s))  HCG, QUANTITATIVE, PREGNANCY     Status: Abnormal   Collection Time    09/05/12  4:55 PM      Result Value Range   hCG, Beta Chain, Quant, S 18586 (*) <5 mIU/mL    A: Appropriate rise in quant hCG  P: Discharge home First trimester warning signs reviewed Patient scheduled for follow-up US with Dr. Tamela Oddi next week.  Patient may return to MAU as needed.   Freddi Starr, PA-C 09/05/2012 6:16 PM

## 2012-09-07 ENCOUNTER — Inpatient Hospital Stay (HOSPITAL_COMMUNITY): Payer: Medicaid Other

## 2012-09-07 ENCOUNTER — Encounter (HOSPITAL_COMMUNITY): Payer: Self-pay

## 2012-09-07 ENCOUNTER — Inpatient Hospital Stay (HOSPITAL_COMMUNITY)
Admission: AD | Admit: 2012-09-07 | Discharge: 2012-09-08 | Disposition: A | Discharge status: Home / Self Care | Payer: Medicaid Other | Source: Ambulatory Visit | Attending: Obstetrics & Gynecology | Admitting: Obstetrics & Gynecology

## 2012-09-07 DIAGNOSIS — R109 Unspecified abdominal pain: Secondary | ICD-10-CM | POA: Insufficient documentation

## 2012-09-07 DIAGNOSIS — O200 Threatened abortion: Secondary | ICD-10-CM | POA: Insufficient documentation

## 2012-09-07 DIAGNOSIS — N831 Corpus luteum cyst of ovary, unspecified side: Secondary | ICD-10-CM | POA: Insufficient documentation

## 2012-09-07 DIAGNOSIS — O34599 Maternal care for other abnormalities of gravid uterus, unspecified trimester: Secondary | ICD-10-CM | POA: Insufficient documentation

## 2012-09-07 NOTE — MAU Note (Signed)
Pt reports continued cramping since her visit here on 09/01, one spot of blood on underwear today.

## 2012-09-07 NOTE — MAU Provider Note (Signed)
Chief Complaint: Dysmenorrhea   First Provider Initiated Contact with Patient 09/07/12 2251     SUBJECTIVE HPI: Sara Park is a 22 y.o. [id number] at [redacted]w[redacted]d by LMP who presents with seeing a small amount of bloody discharge with wiping x2 today and continued low abdominal cramping since previous MAU visits. Ultrasound last week showed 5 week 1 day gestational sac and yolk sac, no fetal pole visible. Has not tried anything for the pain. Para no aggravating or alleviating factors. Has viability ultrasound scheduled 09/14/2012 at [facility]  Past Medical History  Diagnosis Date  . H/O abdominal surgery   . Endometriosis   . Other and unspecified ovarian cysts   . UTI (lower urinary tract infection)   . Chlamydia    OB History  Gravida Para Term Preterm AB SAB TAB Ectopic Multiple Living  4 1 1  2 1 1   1     # Outcome Date GA Lbr Len/2nd Weight Sex Delivery Anes PTL Lv  4 CUR           3 SAB           2 TRM     F SVD   Y  1 TAB              Past Surgical History  Procedure Laterality Date  . Tonsillectomy    . Abdominal exploration surgery  2013    to remove cysts & adhesions   History   Social History  . Marital Status: Married    Spouse Name: N/A    Number of Children: N/A  . Years of Education: N/A   Occupational History  . Not on file.   Social History Main Topics  . Smoking status: Never Smoker   . Smokeless tobacco: Not on file  . Alcohol Use: No  . Drug Use: No  . Sexual Activity: Yes    Birth Control/ Protection: None   Other Topics Concern  . Not on file   Social History Narrative  . No narrative on file   No current facility-administered medications on file prior to encounter.   Current Outpatient Prescriptions on File Prior to Encounter  Medication Sig Dispense Refill  . Prenatal Vit-Fe Fumarate-FA (PRENATAL MULTIVITAMIN) TABS tablet Take 1 tablet by mouth daily at 12 noon.      . famotidine (PEPCID) 20 MG tablet Take 1 tablet (20 mg total) by mouth 2  (two) times daily.  60 tablet  0  . promethazine (PHENERGAN) 25 MG tablet Take 1 tablet (25 mg total) by mouth every 6 (six) hours as needed for nausea.  30 tablet  1   Allergies  Allergen Reactions  . Doxycycline Other (See Comments)    Causes abdominal muscle spasms    ROS: Pertinent items in HPI  OBJECTIVE Blood pressure 116/76, pulse 88, temperature 98.2 F (36.8 C), temperature source Oral, resp. rate 18, height 5\' 1"  (1.549 m), weight 59.875 kg (132 lb), last menstrual period 07/26/2012, SpO2 100.00%. GENERAL: Well-developed, well-nourished female in no acute distress.  HEENT: Normocephalic HEART: normal rate RESP: normal effort ABDOMEN: Soft, non-tender EXTREMITIES: Nontender, no edema NEURO: Alert and oriented SPECULUM EXAM: Declined due to recent exam.  LAB RESULTS Blood type B+  IMAGING 6 weeks 1 day live single intrauterine pregnancy. Fetal heart rate 113. 1.2x1.3x1.5 cm right corpus luteum cyst.  MAU COURSE No further bleeding while in maternity admissions.  ASSESSMENT 1. Threatened miscarriage   2. Corpus luteum cyst    PLAN Discharge home  in stable condition per consult with Dr. Tamela Oddi. Pelvic rest x1 week. Comfort measures.   Follow-up Information   Call [facility]. (To schedule your next appointment or as needed if symptoms worsen)    Contact information:   [address] [place] [address] [phone]      Follow up with [facility]. (As needed in emergencies)    Contact information:   [address] [id number] [place] [address] [phone]        Medication List         famotidine 20 MG tablet  Commonly known as:  PEPCID  Take 1 tablet (20 mg total) by mouth 2 (two) times daily.     prenatal multivitamin Tabs tablet  Take 1 tablet by mouth daily at 12 noon.     promethazine 25 MG tablet  Commonly known as:  PHENERGAN  Take 1 tablet  (25 mg total) by mouth every 6 (six) hours as needed for nausea.     traMADol 50 MG tablet  Commonly known as:  ULTRAM  Take 1 tablet (50 mg total) by mouth every 6 (six) hours as needed for pain.       [place], [place] 09/07/2012  10:36 PM

## 2012-09-08 ENCOUNTER — Encounter (HOSPITAL_COMMUNITY): Payer: Self-pay | Admitting: Advanced Practice Midwife

## 2012-09-08 DIAGNOSIS — O200 Threatened abortion: Secondary | ICD-10-CM

## 2012-09-08 MED ORDER — TRAMADOL HCL 50 MG PO TABS: 50.0000 mg | ORAL_TABLET | Freq: Four times a day (QID) | ORAL | Status: DC | PRN

## 2012-09-14 ENCOUNTER — Ambulatory Visit (INDEPENDENT_AMBULATORY_CARE_PROVIDER_SITE_OTHER): Payer: Medicaid Other | Admitting: Obstetrics & Gynecology

## 2012-09-14 ENCOUNTER — Encounter: Payer: Self-pay | Admitting: Obstetrics & Gynecology

## 2012-09-14 VITALS — BP 119/72 | Temp 97.6°F | Wt 132.0 lb

## 2012-09-14 DIAGNOSIS — Z3481 Encounter for supervision of other normal pregnancy, first trimester: Secondary | ICD-10-CM

## 2012-09-14 DIAGNOSIS — Z3201 Encounter for pregnancy test, result positive: Secondary | ICD-10-CM

## 2012-09-14 LAB — POCT URINALYSIS DIPSTICK
Leukocytes, UA: NEGATIVE
Nitrite, UA: NEGATIVE
Protein, UA: NEGATIVE
Spec Grav, UA: 1.015
Urobilinogen, UA: NEGATIVE

## 2012-09-14 NOTE — Progress Notes (Signed)
P- 88 Pt states she is having pain in her left knee. Pt states she is also having menstrual like cramping.   Subjective:    Sara Park is being seen today for her first obstetrical visit.  This is not a planned pregnancy. She is at [redacted]w[redacted]d gestation. Her obstetrical history is significant for none. Relationship with FOB: spouse, living together. Patient does not intend to breast feed. Pregnancy history fully reviewed.  Menstrual History: OB History   Grav Para Term Preterm Abortions TAB SAB Ect Mult Living   4 1 1  2 1 1   1       Menarche age: 17 Irregular  Patient's last menstrual period was 07/26/2012.    The following portions of the patient's history were reviewed and updated as appropriate: allergies, current medications, past family history, past medical history, past social history, past surgical history and problem list.  Review of Systems Pertinent items are noted in HPI.    Objective:   General Appearance:    Alert, cooperative, no distress, appears stated age  Head:    Normocephalic, without obvious abnormality, atraumatic  Eyes:    PERRL, conjunctiva/corneas clear, EOM's intact, fundi    benign, both eyes  Ears:    Normal TM's and external ear canals, both ears  Nose:   Nares normal, septum midline, mucosa normal, no drainage    or sinus tenderness  Throat:   Lips, mucosa, and tongue normal; teeth and gums normal  Neck:   Supple, symmetrical, trachea midline, no adenopathy;    thyroid:  no enlargement/tenderness/nodules; no carotid   bruit or JVD  Back:     Symmetric, no curvature, ROM normal, no CVA tenderness  Lungs:     Clear to auscultation bilaterally, respirations unlabored  Chest Wall:    No tenderness or deformity   Heart:    Regular rate and rhythm, S1 and S2 normal, no murmur, rub   or gallop  Breast Exam:    No tenderness, masses, or nipple abnormality  Abdomen:     Soft, non-tender, bowel sounds active all four quadrants,    no masses, no organomegaly   Genitalia:    Normal female without lesion, discharge or tenderness  Extremities:   Extremities normal, atraumatic, no cyanosis or edema  Pulses:   2+ and symmetric all extremities  Skin:   Skin color, texture, turgor normal, no rashes or lesions  Lymph nodes:   Cervical, supraclavicular, and axillary nodes normal  Neurologic:   CNII-XII intact, normal strength, sensation and reflexes    throughout  Cardiac activity on U/S  Assessment:    Pregnancy at [redacted]w[redacted]d weeks    Plan:    Initial labs drawn. Prenatal vitamins.  Counseling provided regarding continued use of seat belts, cessation of alcohol consumption, smoking or use of illicit drugs; infection precautions i.e., influenza/TDAP immunizations, toxoplasmosis,CMV, parvovirus, listeria and varicella; workplace safety, exercise during pregnancy; routine dental care, safe medications, sexual activity, hot tubs, saunas, pools, travel, caffeine use, fish and methlymercury, potential toxins, hair treatments, varicose veins Weight gain recommendations reviewed: normal weight/BMI 18.5 - 24.9--> gain 25 - 35 lbs Problem list reviewed and updated. Amniocentesis discussed: not indicated. Follow up in 4 weeks. 50% of 20 min visit spent on counseling and coordination of care.

## 2012-09-15 ENCOUNTER — Encounter: Payer: Self-pay | Admitting: Obstetrics & Gynecology

## 2012-09-15 LAB — OBSTETRIC PANEL
Antibody Screen: NEGATIVE
Eosinophils Absolute: 0.1 10*3/uL (ref 0.0–0.7)
Eosinophils Relative: 2 % (ref 0–5)
HCT: 38.3 % (ref 36.0–46.0)
Lymphocytes Relative: 20 % (ref 12–46)
Lymphs Abs: 1.3 10*3/uL (ref 0.7–4.0)
MCH: 27.6 pg (ref 26.0–34.0)
MCV: 83.3 fL (ref 78.0–100.0)
Monocytes Absolute: 0.8 10*3/uL (ref 0.1–1.0)
RBC: 4.6 MIL/uL (ref 3.87–5.11)
Rh Type: POSITIVE
Rubella: 1.28 Index — ABNORMAL HIGH (ref <0.90)
WBC: 6.9 10*3/uL (ref 4.0–10.5)

## 2012-09-15 LAB — CYSTIC FIBROSIS DIAGNOSTIC STUDY

## 2012-09-15 LAB — HIV ANTIBODY (ROUTINE TESTING W REFLEX): HIV: NONREACTIVE

## 2012-09-15 LAB — VITAMIN D 25 HYDROXY (VIT D DEFICIENCY, FRACTURES): Vit D, 25-Hydroxy: 10 ng/mL — ABNORMAL LOW (ref 30–89)

## 2012-09-15 NOTE — Patient Instructions (Signed)
Prenatal Care  WHAT IS PRENATAL CARE?  Prenatal care means health care during your pregnancy, before your baby is born. Take care of yourself and your baby by:   Getting early prenatal care. If you know you are pregnant, or think you might be pregnant, call your caregiver as soon as possible. Schedule a visit for a general/prenatal examination.  Getting regular prenatal care. Follow your caregiver's schedule for blood and other necessary tests. Do not miss appointments.  Do everything you can to keep yourself and your baby healthy during your pregnancy.  Prenatal care should include evaluation of medical, dietary, educational, psychological, and social needs for the couple and the medical, surgical, and genetic history of the family of the mother and father.  Discuss with your caregiver:  Your medicines, prescription, over-the-counter, and herbal medicines.  Substance abuse, alcohol, smoking, and illegal drugs.  Domestic abuse and violence, if present.  Your immunizations.  Nutrition and diet.  Exercising.  Environment and occupational hazards, at home and at work.  History of sexually transmitted disease, both you and your partner.  Previous pregnancies. WHY IS PRENATAL CARE SO IMPORTANT?  By seeing you regularly, your caregiver has the chance to find problems early, so that they can be treated as soon as possible. Other problems might be prevented. Many studies have shown that early and regular prenatal care is important for the health of both mothers and their babies.  I AM THINKING ABOUT GETTING PREGNANT. HOW CAN I TAKE CARE OF MYSELF?  Taking care of yourself before you get pregnant helps you to have a healthy pregnancy. It also lowers your chances of having a baby born with a birth defect. Here are ways to take care of yourself before you get pregnant:   Eat healthy foods, exercise regularly (30 minutes per day for most days of the week is best), and get enough rest and  sleep. Talk to your caregiver about what kinds of foods and exercises are best for you.  Take 400 micrograms (mcg) of folic acid (one of the B vitamins) every day. The best way to do this is to take a daily multivitamin pill that contains this amount of folic acid. Getting enough of the synthetic (manufactured) form of folic acid every day before you get pregnant and during early pregnancy can help prevent certain birth defects. Many breakfast cereals and other grain products have folic acid added to them, but only certain cereals contain 400 mcg of folic acid per serving. Check the label on your multivitamin or cereal to find the amount of folic acid in the food.  See your caregiver for a complete check up before getting pregnant. Make sure that you have had all your immunization shots, especially for rubella (German measles). Rubella can cause serious birth defects. Chickenpox is another illness you want to avoid during pregnancy. If you have had chickenpox and rubella in the past, you should be immune to them.  Tell your caregiver about any prescription or non-prescription medicines (including herbal remedies) you are taking. Some medicines are not safe to take during pregnancy.  Stop smoking cigarettes, drinking alcohol, or taking illegal drugs. Ask your caregiver for help, if you need it. You can also get help with alcohol and drugs by talking with a member of your faith community, a counselor, or a trusted friend.  Discuss and treat any medical, social, or psychological problems before getting pregnant.  Discuss any history of genetic problems in the mother, father, and their families. Do   genetic testing before getting pregnant, when possible.  Discuss any physical or emotional abuse with your caregiver.  Discuss with your caregiver if you might be exposed to harmful chemicals on your job or where you live.  Discuss with your caregiver if you think your job or the hours you work may be  harmful and should be changed.  The father should be involved with the decision making and with all aspects of the pregnancy, labor, and delivery.  If you have medical insurance, make sure you are covered for pregnancy. I JUST FOUND OUT THAT I AM PREGNANT. HOW CAN I TAKE CARE OF MYSELF?  Here are ways to take care of yourself and the precious new life growing inside you:   Continue taking your multivitamin with 400 micrograms (mcg) of folic acid every day.  Get early and regular prenatal care. It does not matter if this is your first pregnancy or if you already have children. It is very important to see a caregiver during your pregnancy. Your caregiver will check at each visit to make sure that you and the baby are healthy. If there are any problems, action can be taken right away to help you and the baby.  Eat a healthy diet that includes:  Fruits.  Vegetables.  Foods low in saturated fat.  Grains.  Calcium-rich foods.  Drink 6 to 8 glasses of liquids a day.  Unless your caregiver tells you not to, try to be physically active for 30 minutes, most days of the week. If you are pressed for time, you can get your activity in through 10 minute segments, three times a day.  If you smoke, drink alcohol, or use drugs, STOP. These can cause long-term damage to your baby. Talk with your caregiver about steps to take to stop smoking. Talk with a member of your faith community, a counselor, a trusted friend, or your caregiver if you are concerned about your alcohol or drug use.  Ask your caregiver before taking any medicine, even over-the-counter medicines. Some medicines are not safe to take during pregnancy.  Get plenty of rest and sleep.  Avoid hot tubs and saunas during pregnancy.  Do not have X-rays taken, unless absolutely necessary and with the recommendation of your caregiver. A lead shield can be placed on your abdomen, to protect the baby when X-rays are taken in other parts of the  body.  Do not empty the cat litter when you are pregnant. It may contain a parasite that causes an infection called toxoplasmosis, which can cause birth defects. Also, use gloves when working in garden areas used by cats.  Do not eat uncooked or undercooked cheese, meats, or fish.  Stay away from toxic chemicals like:  Insecticides.  Solvents (some cleaners or paint thinners).  Lead.  Mercury.  Sexual relations may continue until the end of the pregnancy, unless you have a medical problem or there is a problem with the pregnancy and your caregiver tells you not to.  Do not wear high heel shoes, especially during the second half of the pregnancy. You can lose your balance and fall.  Do not take long trips, unless absolutely necessary. Be sure to see your caregiver before going on the trip.  Do not sit in one position for more than 2 hours, when on a trip.  Take a copy of your medical records when going on a trip.  Know where there is a hospital in the city you are visiting, in case of an   emergency.  Most dangerous household products will have pregnancy warnings on their labels. Ask your caregiver about products if you are unsure.  Limit or eliminate your caffeine intake from coffee, tea, sodas, medicines, and chocolate.  Many women continue working through pregnancy. Staying active might help you stay healthier. If you have a question about the safety or the hours you work at your particular job, talk with your caregiver.  Get informed:  Read books.  Watch videos.  Go to childbirth classes for you and the father.  Talk with experienced moms.  Ask your caregiver about childbirth education classes for you and your partner. Classes can help you and your partner prepare for the birth of your baby.  Ask about a pediatrician (baby doctor) and methods and pain medicine for labor, delivery, and possible Cesarean delivery (C-section). I AM NOT THINKING ABOUT GETTING PREGNANT  RIGHT NOW, BUT HEARD THAT ALL WOMEN SHOULD TAKE FOLIC ACID EVERY DAY?  All women of childbearing age, with even a remote chance of getting pregnant, should try to make sure they get enough folic acid. Many pregnancies are not planned. Many women do not know they are actually pregnant early in their pregnancies, and certain birth defects happen in the very early part of pregnancy. Taking 400 micrograms (mcg) of folic acid every day will help prevent certain birth defects that happen in the early part of pregnancy. If a woman begins taking vitamin pills in the second or third month of pregnancy, it may be too late to prevent birth defects. Folic acid may also have other health benefits for women, besides preventing birth defects.  HOW OFTEN SHOULD I SEE MY CAREGIVER DURING PREGNANCY?  Your caregiver will give you a schedule for your prenatal visits. You will have visits more often as you get closer to the end of your pregnancy. An average pregnancy lasts about 40 weeks.  A typical schedule includes visiting your caregiver:   About once each month, during your first 6 months of pregnancy.  Every 2 weeks, during the next 2 months.  Weekly in the last month, until the delivery date. Your caregiver will probably want to see you more often if:  You are over 35.  Your pregnancy is high risk, because you have certain health problems or problems with the pregnancy, such as:  Diabetes.  High blood pressure.  The baby is not growing on schedule, according to the dates of the pregnancy. Your caregiver will do special tests, to make sure you and the baby are not having any serious problems. WHAT HAPPENS DURING PRENATAL VISITS?   At your first prenatal visit, your caregiver will talk to you about you and your partner's health history and your family's health history, and will do a physical exam.  On your first visit, a physical exam will include checks of your blood pressure, height and weight, and an  exam of your pelvic organs. Your caregiver will do a Pap test if you have not had one recently, and will do cultures of your cervix to make sure there is no infection.  At each visit, there will be tests of your blood, urine, blood pressure, weight, and checking the progress of the baby.  Your caregiver will be able to tell you when to expect that your baby will be born.  Each visit is also a chance for you to learn about staying healthy during pregnancy and for asking questions.  Discuss whether you will be breastfeeding.  At your later prenatal   visits, your caregiver will check how you are doing and how the baby is developing. You may have a number of tests done as your pregnancy progresses.  Ultrasound exams are often used to check on the baby's growth and health.  You may have more urine and blood tests, as well as special tests, if needed. These may include amniocentesis (examine fluid in the pregnancy sac), stress tests (check how baby responds to contractions), biophysical profile (measures fetus well-being). Your caregiver will explain the tests and why they are necessary. I AM IN MY LATE THIRTIES, AND I WANT TO HAVE A CHILD NOW. SHOULD I DO ANYTHING SPECIAL?  As you get older, there is more chance of having a medical problem (high blood pressure), pregnancy problem (preeclampsia, problems with the placenta), miscarriage, or a baby born with a birth defect. However, most women in their late thirties and early forties have healthy babies. See your caregiver on a regular basis before you get pregnant and be sure to go for exams throughout your pregnancy. Your caregiver probably will want to do some special tests to check on you and your baby's health when you are pregnant.  Women today are often delaying having children until later in life, when they are in their thirties and forties. While many women in their thirties and forties have no difficulty getting pregnant, fertility does decline  with age. For women over 40 who cannot get pregnant after 6 months of trying, it is recommended that they see their caregiver for a fertility evaluation. It is not uncommon to have trouble becoming pregnant or experience infertility (inability to become pregnant after trying for one year). If you think that you or your partner may be infertile, you can discuss this with your caregiver. He or she can recommend treatments such as drugs, surgery, or assisted reproductive technology.  Document Released: 12/25/2002 Document Revised: 03/16/2011 Document Reviewed: 11/21/2008 Exit[employer] Patient Information 2014 [employer].  

## 2012-09-16 ENCOUNTER — Encounter: Payer: Self-pay | Admitting: Obstetrics & Gynecology

## 2012-09-16 DIAGNOSIS — IMO0002 Reserved for concepts with insufficient information to code with codable children: Secondary | ICD-10-CM | POA: Insufficient documentation

## 2012-09-16 DIAGNOSIS — E559 Vitamin D deficiency, unspecified: Secondary | ICD-10-CM | POA: Insufficient documentation

## 2012-09-16 LAB — HEMOGLOBINOPATHY EVALUATION: Hgb A: 97.4 % (ref 96.8–97.8)

## 2012-09-16 LAB — PAP IG, CT-NG, RFX HPV ASCU
Chlamydia Probe Amp: NEGATIVE
GC Probe Amp: NEGATIVE

## 2012-09-16 LAB — CULTURE, OB URINE
Colony Count: NO GROWTH
Organism ID, Bacteria: NO GROWTH

## 2012-09-17 ENCOUNTER — Encounter (HOSPITAL_COMMUNITY): Payer: Self-pay | Admitting: *Deleted

## 2012-09-17 ENCOUNTER — Inpatient Hospital Stay (HOSPITAL_COMMUNITY)
Admission: AD | Admit: 2012-09-17 | Discharge: 2012-09-18 | Disposition: A | Discharge status: Home / Self Care | Payer: Medicaid Other | Source: Ambulatory Visit | Attending: Obstetrics & Gynecology | Admitting: Obstetrics & Gynecology

## 2012-09-17 DIAGNOSIS — O209 Hemorrhage in early pregnancy, unspecified: Secondary | ICD-10-CM | POA: Insufficient documentation

## 2012-09-17 DIAGNOSIS — R109 Unspecified abdominal pain: Secondary | ICD-10-CM | POA: Insufficient documentation

## 2012-09-17 NOTE — MAU Note (Signed)
Patient states she had some bloody discharge with clots yesterday but did not have any pain. Today it changed to bright red bleeding and heavy intense cramps.

## 2012-09-18 ENCOUNTER — Inpatient Hospital Stay (HOSPITAL_COMMUNITY): Payer: Medicaid Other

## 2012-09-18 DIAGNOSIS — O4690 Antepartum hemorrhage, unspecified, unspecified trimester: Secondary | ICD-10-CM

## 2012-09-18 LAB — URINE MICROSCOPIC-ADD ON

## 2012-09-18 LAB — URINALYSIS, ROUTINE W REFLEX MICROSCOPIC
Bilirubin Urine: NEGATIVE
Nitrite: NEGATIVE
Specific Gravity, Urine: >1.03 — ABNORMAL HIGH (ref 1.005–1.030)
Urobilinogen, UA: 0.2 mg/dL (ref 0.0–1.0)

## 2012-09-18 NOTE — MAU Provider Note (Signed)
History     CSN: [id number]  Arrival date and time: 09/17/12 2325   First Provider Initiated Contact with Patient 09/17/12 2354      Chief Complaint  Patient presents with  . Abdominal Cramping  . Vaginal Bleeding   HPI  Pt is a G4P1021 at [redacted]w[redacted]d weeks here with report of vaginal bleeding that increased today.  Pt report passing clots and having heavy amount of bleeding.  [place] on 9/3 showed an IUP with a FHR of 113.  Past Medical History  Diagnosis Date  . H/O abdominal surgery   . Endometriosis   . Other and unspecified ovarian cysts   . UTI (lower urinary tract infection)   . Chlamydia     Past Surgical History  Procedure Laterality Date  . Tonsillectomy    . Abdominal exploration surgery  2013    to remove cysts & adhesions    Family History  Problem Relation Age of Onset  . Hypertension Mother   . Diabetes Mother   . Hypertension Father   . Diabetes Father     History  Substance Use Topics  . Smoking status: Never Smoker   . Smokeless tobacco: Not on file  . Alcohol Use: No    Allergies:  Allergies  Allergen Reactions  . Doxycycline Other (See Comments)    Causes abdominal muscle spasms    Prescriptions prior to admission  Medication Sig Dispense Refill  . famotidine (PEPCID) 20 MG tablet Take 1 tablet (20 mg total) by mouth 2 (two) times daily.  60 tablet  0  . Prenatal Vit-Fe Fumarate-FA (PRENATAL MULTIVITAMIN) TABS tablet Take 1 tablet by mouth daily at 12 noon.      . promethazine (PHENERGAN) 25 MG tablet Take 1 tablet (25 mg total) by mouth every 6 (six) hours as needed for nausea.  30 tablet  1  . traMADol (ULTRAM) 50 MG tablet Take 1 tablet (50 mg total) by mouth every 6 (six) hours as needed for pain.  20 tablet  0    Review of Systems  Gastrointestinal: Positive for abdominal pain (cramping).  Genitourinary:       Vaginal bleeding  All other systems reviewed and are negative.   Physical Exam   Blood pressure 116/70, pulse 92,  temperature 99.7 F (37.6 C), temperature source Oral, resp. rate 18, height 5\' 4"  (1.626 m), weight 61.689 kg (136 lb), last menstrual period 07/26/2012, SpO2 100.00%.  Physical Exam  Constitutional: She is oriented to person, place, and time. She appears well-developed and well-nourished. No distress.  HENT:  Head: Normocephalic.  Neck: Normal range of motion. Neck supple.  Cardiovascular: Normal rate, regular rhythm and normal heart sounds.   Respiratory: Effort normal and breath sounds normal. No respiratory distress.  GI: Soft. There is tenderness (suprapubic).  Genitourinary: Cervix exhibits no discharge. There is bleeding (scant) around the vagina.  Cervical os closed  Neurological: She is alert and oriented to person, place, and time.  Skin: Skin is warm and dry.    MAU Course  Procedures  Results for orders placed during the hospital encounter of 09/17/12 (from the past 24 hour(s))  URINALYSIS, ROUTINE W REFLEX MICROSCOPIC     Status: Abnormal   Collection Time    09/17/12 11:27 PM      Result Value Range   Color, Urine YELLOW  YELLOW   APPearance CLEAR  CLEAR   Specific Gravity, Urine >1.030 (*) 1.005 - 1.030   pH 6.0  5.0 - 8.0  Glucose, UA NEGATIVE  NEGATIVE mg/dL   Hgb urine dipstick LARGE (*) NEGATIVE   Bilirubin Urine NEGATIVE  NEGATIVE   Ketones, ur NEGATIVE  NEGATIVE mg/dL   Protein, ur NEGATIVE  NEGATIVE mg/dL   Urobilinogen, UA 0.2  0.0 - 1.0 mg/dL   Nitrite NEGATIVE  NEGATIVE   Leukocytes, UA NEGATIVE  NEGATIVE  URINE MICROSCOPIC-ADD ON     Status: None   Collection Time    09/17/12 11:27 PM      Result Value Range   Squamous Epithelial / LPF RARE  RARE   WBC, UA 0-2  <3 WBC/hpf   RBC / HPF 0-2  <3 RBC/hpf   Bacteria, UA RARE  RARE   Urine-Other MUCOUS PRESENT     Ultrasound: FINDINGS:  Intrauterine gestational sac: Visualized. Small subchorionic  hemorrhage noted, measuring 1.2 x 0.6 x 1.3 cm. The hemorrhage  contacts less than 25% of the  gestational sac circumference.  Yolk sac: Present, unremarkable.  Embryo: Present.  Cardiac Activity: Documented.  Heart Rate: 158 bpm  CRL: 1.42 cm 7 w 6 d [place] EDC: 05/02/2013  Maternal uterus/adnexae: Posterior myometrial thickening,  nonspecific and possibly transient. No adnexal mass. Mild asymmetric  ovarian size accounted for by right corpus luteum.  IMPRESSION:  1. Single, living intrauterine pregnancy, 7 weeks 6 days gestational  age. Expected growth since comparison 09/07/2012.  2. Small subchorionic hemorrhage.  Assessment and Plan  Bleeding in Pregnancy  Plan: Discharge home Provided reassurance Bleeding precautions Keep scheduled appointment  [facility] 09/18/2012, 12:19 AM

## 2012-09-20 ENCOUNTER — Other Ambulatory Visit: Payer: Self-pay | Admitting: Obstetrics & Gynecology

## 2012-09-20 ENCOUNTER — Ambulatory Visit (INDEPENDENT_AMBULATORY_CARE_PROVIDER_SITE_OTHER): Payer: Medicaid Other

## 2012-09-20 ENCOUNTER — Encounter: Payer: Self-pay | Admitting: Obstetrics & Gynecology

## 2012-09-20 DIAGNOSIS — O200 Threatened abortion: Secondary | ICD-10-CM

## 2012-09-20 DIAGNOSIS — Z3401 Encounter for supervision of normal first pregnancy, first trimester: Secondary | ICD-10-CM

## 2012-09-20 DIAGNOSIS — O3680X1 Pregnancy with inconclusive fetal viability, fetus 1: Secondary | ICD-10-CM

## 2012-09-20 DIAGNOSIS — Z3400 Encounter for supervision of normal first pregnancy, unspecified trimester: Secondary | ICD-10-CM

## 2012-09-20 LAB — US OB DETAIL + 14 WK

## 2012-09-22 ENCOUNTER — Encounter: Payer: Self-pay | Admitting: Obstetrics & Gynecology

## 2012-09-22 ENCOUNTER — Ambulatory Visit (INDEPENDENT_AMBULATORY_CARE_PROVIDER_SITE_OTHER): Payer: Medicaid Other | Admitting: Obstetrics & Gynecology

## 2012-09-22 VITALS — BP 107/74 | Temp 99.3°F | Wt 133.0 lb

## 2012-09-22 DIAGNOSIS — Z3480 Encounter for supervision of other normal pregnancy, unspecified trimester: Secondary | ICD-10-CM | POA: Insufficient documentation

## 2012-09-22 DIAGNOSIS — Z3481 Encounter for supervision of other normal pregnancy, first trimester: Secondary | ICD-10-CM

## 2012-09-22 DIAGNOSIS — O200 Threatened abortion: Secondary | ICD-10-CM

## 2012-09-22 LAB — POCT URINALYSIS DIPSTICK
Blood, UA: NEGATIVE
Protein, UA: NEGATIVE
Spec Grav, UA: 1.025
Urobilinogen, UA: NEGATIVE

## 2012-09-22 NOTE — Progress Notes (Signed)
P 101 Patient reports she was bleeding since last Friday- [facility]- [place] normal- patient reports she stopped bleeding today.. Patient reports she is still having cramping.

## 2012-09-22 NOTE — Patient Instructions (Signed)
Threatened Miscarriage  Bleeding during the first 20 weeks of pregnancy is common. This is sometimes called a threatened miscarriage. This is a pregnancy that is threatening to end before the twentieth week of pregnancy. Often this bleeding stops with bed rest or decreased activities as suggested by your caregiver and the pregnancy continues without any more problems. You may be asked to not have sexual intercourse, have orgasms or use tampons until further notice. Sometimes a threatened miscarriage can progress to a complete or incomplete miscarriage. This may or may not require further treatment. Some miscarriages occur before a woman misses a menstrual period and knows she is pregnant.  Miscarriages occur in 15 to 20% of all pregnancies and usually occur during the first 13 weeks of the pregnancy. The exact cause of a miscarriage is usually never known. A miscarriage is natures way of ending a pregnancy that is abnormal or would not make it to term. There are some things that may put you at risk to have a miscarriage, such as:  · Hormone problems.  · Infection of the uterus or cervix.  · Chronic illness, diabetes for example, especially if it is not controlled.  · Abnormal shaped uterus.  · Fibroids in the uterus.  · Incompetent cervix (the cervix is too weak to hold the baby).  · Smoking.  · Drinking too much alcohol. It's best not to drink any alcohol when you are pregnant.  · Taking illegal drugs.  TREATMENT   When a miscarriage becomes complete and all products of conception (all the tissue in the uterus) have been passed, often no treatment is needed. If you think you passed tissue, save it in a container and take it to your doctor for evaluation. If the miscarriage is incomplete (parts of the fetus or placenta remain in the uterus), further treatment may be needed. The most common reason for further treatment is continued bleeding (hemorrhage) because pregnancy tissue did not pass out of the uterus. This  often occurs if a miscarriage is incomplete. Tissue left behind may also become infected. Treatment usually is dilatation and curettage (the removal of the remaining products of pregnancy. This can be done by a simple sucking procedure (suction curettage) or a simple scraping of the inside of the uterus. This may be done in the hospital or in the caregiver's office. This is only done when your caregiver knows that there is no chance for the pregnancy to proceed to term. This is determined by physical examination, negative pregnancy test, falling pregnancy hormone count and/or, an ultrasound revealing a dead fetus.  Miscarriages are often a very emotional time for prospective mothers and fathers. This is not you or your partners fault. It did not occur because of an inadequacy in you or your partner. Nearly all miscarriages occur because the pregnancy has started off wrongly. At least half of these pregnancies have a chromosomal abnormality. It is almost always not inherited. Others may have developmental problems with the fetus or placenta. This does not always show up even when the products miscarried are studied under the microscope. The miscarriage is nearly always not your fault and it is not likely that you could have prevented it from happening. If you are having emotional and grieving problems, talk to your health care provider and even seek counseling, if necessary, before getting pregnant again. You can begin trying for another pregnancy as soon as your caregiver says it is OK.  HOME CARE INSTRUCTIONS   · Your caregiver may order   bed rest depending on how much bleeding and cramping you are having. You may be limited to only getting up to go to the bathroom. You may be allowed to continue light activity. You may need to make arrangements for the care of your other children and for any other responsibilities.  · Keep track of the number of pads you use each day, how often you have to change pads and how  saturated (soaked) they are. Record this information.  · DO NOT USE TAMPONS. Do not douche, have sexual intercourse or orgasms until approved by your caregiver.  · You may receive a follow up appointment for re-evaluation of your pregnancy and a repeat blood test. Re-evaluation often occurs after 2 days and again in 4 to 6 weeks. It is very important that you follow-up in the recommended time period.  · If you are Rh negative and the father is Rh positive or you do not know the fathers' blood type, you may receive a shot (Rh immune globulin) to help prevent abnormal antibodies that can develop and affect the baby in any future pregnancies.  SEEK IMMEDIATE MEDICAL CARE IF:  · You have severe cramps in your stomach, back, or abdomen.  · You have a sudden onset of severe pain in the lower part of your abdomen.  · You develop chills.  · You run an unexplained temperature of 101° F (38.3° C) or higher.  · You pass large clots or tissue. Save any tissue for your caregiver to inspect.  · Your bleeding increases or you become light-headed, weak, or have fainting episodes.  · You have a gush of fluid from your vagina.  · You pass out. This could mean you have a tubal (ectopic) pregnancy.  Document Released: 12/22/2004 Document Revised: 03/16/2011 Document Reviewed: 08/08/2007  [employer]® Patient Information ©2014 [employer].

## 2012-09-22 NOTE — Progress Notes (Signed)
Cx closed.  No heme.  Cardiac activity on U/S.

## 2012-09-29 ENCOUNTER — Ambulatory Visit (INDEPENDENT_AMBULATORY_CARE_PROVIDER_SITE_OTHER): Payer: Medicaid Other | Admitting: Obstetrics & Gynecology

## 2012-09-29 ENCOUNTER — Encounter: Payer: Self-pay | Admitting: Obstetrics & Gynecology

## 2012-09-29 VITALS — BP 103/69 | Temp 98.3°F | Wt 135.4 lb

## 2012-09-29 DIAGNOSIS — Z3481 Encounter for supervision of other normal pregnancy, first trimester: Secondary | ICD-10-CM

## 2012-09-29 DIAGNOSIS — Z3480 Encounter for supervision of other normal pregnancy, unspecified trimester: Secondary | ICD-10-CM

## 2012-09-29 LAB — POCT URINALYSIS DIPSTICK
Ketones, UA: NEGATIVE
Spec Grav, UA: 1.015
pH, UA: 8

## 2012-09-29 NOTE — Patient Instructions (Signed)
Vaginal Bleeding During Pregnancy  A small amount of bleeding from the vagina can happen anytime during pregnancy. It usually stops on its own. However, some bleeding can be serious. Be sure to tell your doctor about all vaginal bleeding.  HOME CARE   Get plenty of rest and sleep.   Stay in bed and only get up to go to the bathroom as told by your doctor.   Write down the number of pads you use each day. Note how soaked they are.   Do not use tampons. Do not clean the vagina with a stream of water (douche).   Do not have sex (intercourse) or put anything into your vagina. Have this approved by your doctor.   Save any tissue that comes from your vagina. Show it to your doctor.   Only take medicine as told by your doctor.   Follow your doctor's advice about lifting, driving, and physical activity.  GET HELP RIGHT AWAY IF:    You feel your baby move less or not at all.   You pass out (faint) while going to the bathroom.   You have more bleeding.   You start to have contractions.   You have severe cramps in your stomach, back, or belly (abdomen).   You are leaking fluid or have a gush of fluid from your vagina.   You become lightheaded or weak.   You have chills.   You have clumps of tissue or blood clots coming from your vagina.   You have a fever.  MAKE SURE YOU:    Understand these instructions.   Will watch your condition.   Will get help right away if you are not doing well or get worse.  Document Released: 10/01/2007 Document Revised: 12/09/2011 Document Reviewed: 10/12/2011  Exit[employer] Patient Information 2014 [employer].

## 2012-09-29 NOTE — Progress Notes (Signed)
Pulse- 80.  Pt had a little spotting yesterday and it stopped in the middle of the night.  Cardiac activity on informal U/S.  SPEC: cx closed, no heme.

## 2012-10-06 ENCOUNTER — Ambulatory Visit (INDEPENDENT_AMBULATORY_CARE_PROVIDER_SITE_OTHER): Payer: Medicaid Other | Admitting: Obstetrics & Gynecology

## 2012-10-06 ENCOUNTER — Other Ambulatory Visit: Payer: Self-pay | Admitting: Obstetrics & Gynecology

## 2012-10-06 VITALS — BP 135/82 | Wt 137.0 lb

## 2012-10-06 DIAGNOSIS — Z3481 Encounter for supervision of other normal pregnancy, first trimester: Secondary | ICD-10-CM

## 2012-10-06 DIAGNOSIS — Z3682 Encounter for antenatal screening for nuchal translucency: Secondary | ICD-10-CM

## 2012-10-06 DIAGNOSIS — Z3480 Encounter for supervision of other normal pregnancy, unspecified trimester: Secondary | ICD-10-CM

## 2012-10-06 LAB — POCT URINALYSIS DIPSTICK
Bilirubin, UA: NEGATIVE
Blood, UA: NEGATIVE
Ketones, UA: NEGATIVE
pH, UA: 7

## 2012-10-06 NOTE — Progress Notes (Signed)
Pulse- 114 Pt states she is having some cramping and tightening of her abdomen. Pt states it comes and goes. Pt states it happens about twice a day.

## 2012-10-07 ENCOUNTER — Encounter: Payer: Self-pay | Admitting: Obstetrics & Gynecology

## 2012-10-20 ENCOUNTER — Encounter: Payer: Self-pay | Admitting: Obstetrics & Gynecology

## 2012-10-20 ENCOUNTER — Ambulatory Visit (INDEPENDENT_AMBULATORY_CARE_PROVIDER_SITE_OTHER): Payer: Medicaid Other | Admitting: Obstetrics & Gynecology

## 2012-10-20 VITALS — BP 128/76 | Temp 99.5°F | Wt 138.0 lb

## 2012-10-20 DIAGNOSIS — Z3481 Encounter for supervision of other normal pregnancy, first trimester: Secondary | ICD-10-CM

## 2012-10-20 DIAGNOSIS — Z3480 Encounter for supervision of other normal pregnancy, unspecified trimester: Secondary | ICD-10-CM

## 2012-10-20 LAB — POCT URINALYSIS DIPSTICK
Bilirubin, UA: NEGATIVE
Ketones, UA: NEGATIVE
Protein, UA: NEGATIVE

## 2012-10-20 NOTE — Patient Instructions (Signed)
Sciatica °Sciatica is pain, weakness, numbness, or tingling along the path of the sciatic nerve. The nerve starts in the lower back and runs down the back of each leg. The nerve controls the muscles in the lower leg and in the back of the knee, while also providing sensation to the back of the thigh, lower leg, and the sole of your foot. Sciatica is a symptom of another medical condition. For instance, nerve damage or certain conditions, such as a herniated disk or bone spur on the spine, pinch or put pressure on the sciatic nerve. This causes the pain, weakness, or other sensations normally associated with sciatica. Generally, sciatica only affects one side of the body. °CAUSES  °· Herniated or slipped disc. °· Degenerative disk disease. °· A pain disorder involving the narrow muscle in the buttocks (piriformis syndrome). °· Pelvic injury or fracture. °· Pregnancy. °· Tumor (rare). °SYMPTOMS  °Symptoms can vary from mild to very severe. The symptoms usually travel from the low back to the buttocks and down the back of the leg. Symptoms can include: °· Mild tingling or dull aches in the lower back, leg, or hip. °· Numbness in the back of the calf or sole of the foot. °· Burning sensations in the lower back, leg, or hip. °· Sharp pains in the lower back, leg, or hip. °· Leg weakness. °· Severe back pain inhibiting movement. °These symptoms may get worse with coughing, sneezing, laughing, or prolonged sitting or standing. Also, being overweight may worsen symptoms. °DIAGNOSIS  °Your caregiver will perform a physical exam to look for common symptoms of sciatica. He or she may ask you to do certain movements or activities that would trigger sciatic nerve pain. Other tests may be performed to find the cause of the sciatica. These may include: °· Blood tests. °· X-rays. °· Imaging tests, such as an MRI or CT scan. °TREATMENT  °Treatment is directed at the cause of the sciatic pain. Sometimes, treatment is not necessary  and the pain and discomfort goes away on its own. If treatment is needed, your caregiver may suggest: °· Over-the-counter medicines to relieve pain. °· Prescription medicines, such as anti-inflammatory medicine, muscle relaxants, or narcotics. °· Applying heat or ice to the painful area. °· Steroid injections to lessen pain, irritation, and inflammation around the nerve. °· Reducing activity during periods of pain. °· Exercising and stretching to strengthen your abdomen and improve flexibility of your spine. Your caregiver may suggest losing weight if the extra weight makes the back pain worse. °· Physical therapy. °· Surgery to eliminate what is pressing or pinching the nerve, such as a bone spur or part of a herniated disk. °HOME CARE INSTRUCTIONS  °· Only take over-the-counter or prescription medicines for pain or discomfort as directed by your caregiver. °· Apply ice to the affected area for 20 minutes, 3 4 times a day for the first 48 72 hours. Then try heat in the same way. °· Exercise, stretch, or perform your usual activities if these do not aggravate your pain. °· Attend physical therapy sessions as directed by your caregiver. °· Keep all follow-up appointments as directed by your caregiver. °· Do not wear high heels or shoes that do not provide proper support. °· Check your mattress to see if it is too soft. A firm mattress may lessen your pain and discomfort. °SEEK IMMEDIATE MEDICAL CARE IF:  °· You lose control of your bowel or bladder (incontinence). °· You have increasing weakness in the lower back,   pelvis, buttocks, or legs. °· You have redness or swelling of your back. °· You have a burning sensation when you urinate. °· You have pain that gets worse when you lie down or awakens you at night. °· Your pain is worse than you have experienced in the past. °· Your pain is lasting longer than 4 weeks. °· You are suddenly losing weight without reason. °MAKE SURE YOU: °· Understand these  instructions. °· Will watch your condition. °· Will get help right away if you are not doing well or get worse. °Document Released: 12/16/2000 Document Revised: 06/23/2011 Document Reviewed: 05/03/2011 °[employer]® Patient Information ©2014 [employer]. ° °

## 2012-10-20 NOTE — Progress Notes (Signed)
P 94 Patient c/o lower abdominal pressure; pain shooting down from back-->leg. Supportive measures reviewed.

## 2012-10-21 ENCOUNTER — Ambulatory Visit (HOSPITAL_COMMUNITY)
Admission: RE | Admit: 2012-10-21 | Discharge: 2012-10-21 | Disposition: A | Discharge status: Home / Self Care | Payer: Medicaid Other | Source: Ambulatory Visit | Attending: Obstetrics & Gynecology | Admitting: Obstetrics & Gynecology

## 2012-10-21 ENCOUNTER — Encounter (HOSPITAL_COMMUNITY): Payer: Self-pay

## 2012-10-21 ENCOUNTER — Other Ambulatory Visit: Payer: Self-pay

## 2012-10-21 VITALS — BP 118/74 | HR 87 | Wt 139.0 lb

## 2012-10-21 DIAGNOSIS — R87612 Low grade squamous intraepithelial lesion on cytologic smear of cervix (LGSIL): Secondary | ICD-10-CM

## 2012-10-21 DIAGNOSIS — IMO0002 Reserved for concepts with insufficient information to code with codable children: Secondary | ICD-10-CM

## 2012-10-21 DIAGNOSIS — O3510X0 Maternal care for (suspected) chromosomal abnormality in fetus, unspecified, not applicable or unspecified: Secondary | ICD-10-CM | POA: Insufficient documentation

## 2012-10-21 DIAGNOSIS — O351XX0 Maternal care for (suspected) chromosomal abnormality in fetus, not applicable or unspecified: Secondary | ICD-10-CM | POA: Insufficient documentation

## 2012-10-21 DIAGNOSIS — E559 Vitamin D deficiency, unspecified: Secondary | ICD-10-CM

## 2012-10-21 DIAGNOSIS — Z3682 Encounter for antenatal screening for nuchal translucency: Secondary | ICD-10-CM

## 2012-10-26 ENCOUNTER — Encounter: Payer: Self-pay | Admitting: Obstetrics & Gynecology

## 2012-11-04 ENCOUNTER — Encounter: Payer: Self-pay | Admitting: Obstetrics & Gynecology

## 2012-11-16 ENCOUNTER — Inpatient Hospital Stay (HOSPITAL_COMMUNITY)
Admission: AD | Admit: 2012-11-16 | Discharge: 2012-11-16 | Disposition: A | Discharge status: Home / Self Care | Payer: Medicaid Other | Source: Ambulatory Visit | Attending: Obstetrics & Gynecology | Admitting: Obstetrics & Gynecology

## 2012-11-16 DIAGNOSIS — N949 Unspecified condition associated with female genital organs and menstrual cycle: Secondary | ICD-10-CM

## 2012-11-16 DIAGNOSIS — R109 Unspecified abdominal pain: Secondary | ICD-10-CM | POA: Insufficient documentation

## 2012-11-16 DIAGNOSIS — A499 Bacterial infection, unspecified: Secondary | ICD-10-CM | POA: Insufficient documentation

## 2012-11-16 DIAGNOSIS — O2390 Unspecified genitourinary tract infection in pregnancy, unspecified trimester: Secondary | ICD-10-CM | POA: Insufficient documentation

## 2012-11-16 DIAGNOSIS — N760 Acute vaginitis: Secondary | ICD-10-CM

## 2012-11-16 DIAGNOSIS — B9689 Other specified bacterial agents as the cause of diseases classified elsewhere: Secondary | ICD-10-CM

## 2012-11-16 LAB — URINALYSIS, ROUTINE W REFLEX MICROSCOPIC
Bilirubin Urine: NEGATIVE
Glucose, UA: NEGATIVE mg/dL
Hgb urine dipstick: NEGATIVE
Ketones, ur: NEGATIVE mg/dL
Nitrite: NEGATIVE
Protein, ur: NEGATIVE mg/dL
Specific Gravity, Urine: 1.02 (ref 1.005–1.030)
pH: 6 (ref 5.0–8.0)

## 2012-11-16 LAB — URINE MICROSCOPIC-ADD ON

## 2012-11-16 LAB — WET PREP, GENITAL

## 2012-11-16 MED ORDER — METRONIDAZOLE 500 MG PO TABS: 500.0000 mg | ORAL_TABLET | Freq: Two times a day (BID) | ORAL | Status: DC

## 2012-11-16 NOTE — MAU Note (Signed)
Pt presents to MAU with c/o cramping and abdominal pain. Pt states she is having braxton hicks contractions

## 2012-11-16 NOTE — MAU Note (Signed)
Patient states she has been having mid abdominal pain for about 1 1/2 weeks. States getting worse. Denies bleeding but has a watery taupe color vaginal discharge.

## 2012-11-16 NOTE — MAU Provider Note (Signed)
Chief Complaint: Abdominal Pain   First Provider Initiated Contact with Patient 11/16/12 1748     SUBJECTIVE HPI: Sara Park is a 22 y.o. [id number] at [redacted]w[redacted]d by LMP who presents to maternity admissions reporting cramping/contractions x1.5 weeks, worsening in last 2 days.  She also reports clear discharge with some taupe color mixed in.  She had vaginal bleeding in early pregnancy, which resolved >2 weeks ago.  She denies LOF, vaginal bleeding, vaginal itching/burning, urinary symptoms, h/a, dizziness, n/v, or fever/chills.   Past Medical History  Diagnosis Date  . H/O abdominal surgery   . Endometriosis   . Other and unspecified ovarian cysts   . UTI (lower urinary tract infection)   . Chlamydia    Past Surgical History  Procedure Laterality Date  . Tonsillectomy    . Abdominal exploration surgery  2013    to remove cysts & adhesions   History   Social History  . Marital Status: Married    Spouse Name: N/A    Number of Children: N/A  . Years of Education: N/A   Occupational History  . Not on file.   Social History Main Topics  . Smoking status: Never Smoker   . Smokeless tobacco: Not on file  . Alcohol Use: No  . Drug Use: No  . Sexual Activity: Yes    Birth Control/ Protection: None   Other Topics Concern  . Not on file   Social History Narrative  . No narrative on file   No current facility-administered medications on file prior to encounter.   Current Outpatient Prescriptions on File Prior to Encounter  Medication Sig Dispense Refill  . famotidine (PEPCID) 20 MG tablet Take 1 tablet (20 mg total) by mouth 2 (two) times daily.  60 tablet  0  . Prenatal Vit-Fe Fumarate-FA (PRENATAL MULTIVITAMIN) TABS tablet Take 1 tablet by mouth daily at 12 noon.      . promethazine (PHENERGAN) 25 MG tablet Take 1 tablet (25 mg total) by mouth every 6 (six) hours as needed for nausea.  30 tablet  1  . traMADol (ULTRAM) 50 MG tablet Take 1 tablet (50 mg total) by mouth every 6  (six) hours as needed for pain.  20 tablet  0   Allergies  Allergen Reactions  . Doxycycline Other (See Comments)    Causes abdominal muscle spasms    ROS: Pertinent items in HPI  OBJECTIVE Blood pressure 120/67, pulse 114, resp. rate 16, height 5' 2.5" (1.588 m), weight 65.409 kg (144 lb 3.2 oz), last menstrual period 07/26/2012, SpO2 100.00%. GENERAL: Well-developed, well-nourished female in no acute distress.  HEENT: Normocephalic HEART: normal rate RESP: normal effort ABDOMEN: Soft, non-tender EXTREMITIES: Nontender, no edema NEURO: Alert and oriented Pelvic exam: Cervix pink, visually closed, without lesion, large amount thin clear mucus discharge plus thick white discharge clinging to cervix and vaginal walls, vaginal walls and external genitalia normal Bimanual exam: Cervix 0/long/high, firm, anterior, neg CMT, uterus nontender, nonenlarged, adnexa without tenderness, enlargement, or mass  Cervix 0/long/high, posterior, firm  FHT 160  LAB RESULTS Results for orders placed during the hospital encounter of 11/16/12 (from the past 24 hour(s))  URINALYSIS, ROUTINE W REFLEX MICROSCOPIC     Status: Abnormal   Collection Time    11/16/12  3:20 PM      Result Value Range   Color, Urine YELLOW  YELLOW   APPearance CLEAR  CLEAR   Specific Gravity, Urine 1.020  1.005 - 1.030   pH 6.0  5.0 -  8.0   Glucose, UA NEGATIVE  NEGATIVE mg/dL   Hgb urine dipstick NEGATIVE  NEGATIVE   Bilirubin Urine NEGATIVE  NEGATIVE   Ketones, ur NEGATIVE  NEGATIVE mg/dL   Protein, ur NEGATIVE  NEGATIVE mg/dL   Urobilinogen, UA 0.2  0.0 - 1.0 mg/dL   Nitrite NEGATIVE  NEGATIVE   Leukocytes, UA TRACE (*) NEGATIVE  URINE MICROSCOPIC-ADD ON     Status: Abnormal   Collection Time    11/16/12  3:20 PM      Result Value Range   Squamous Epithelial / LPF MANY (*) RARE   WBC, UA 3-6  <3 WBC/hpf   Bacteria, UA FEW (*) RARE    ASSESSMENT 1. Bacterial vaginosis   2. Round ligament pain      PLAN Discharge home Flagyl BID x7 days Increase PO fluids, rest, ice, heat, Tylenol for round ligament pain F/U as scheduled with Dr Tamela Oddi Return to MAU as needed    Medication List    ASK your doctor about these medications       famotidine 20 MG tablet  Commonly known as:  PEPCID  Take 1 tablet (20 mg total) by mouth 2 (two) times daily.     prenatal multivitamin Tabs tablet  Take 1 tablet by mouth daily at 12 noon.     promethazine 25 MG tablet  Commonly known as:  PHENERGAN  Take 1 tablet (25 mg total) by mouth every 6 (six) hours as needed for nausea.     traMADol 50 MG tablet  Commonly known as:  ULTRAM  Take 1 tablet (50 mg total) by mouth every 6 (six) hours as needed for pain.         Sharen Counter Certified Nurse-Midwife 11/16/2012  6:12 PM

## 2012-11-17 ENCOUNTER — Ambulatory Visit (INDEPENDENT_AMBULATORY_CARE_PROVIDER_SITE_OTHER): Payer: Medicaid Other | Admitting: Obstetrics & Gynecology

## 2012-11-17 VITALS — BP 121/73 | Temp 98.7°F | Wt 146.0 lb

## 2012-11-17 DIAGNOSIS — Z3480 Encounter for supervision of other normal pregnancy, unspecified trimester: Secondary | ICD-10-CM

## 2012-11-17 DIAGNOSIS — Z3482 Encounter for supervision of other normal pregnancy, second trimester: Secondary | ICD-10-CM

## 2012-11-17 LAB — POCT URINALYSIS DIPSTICK
Blood, UA: NEGATIVE
Ketones, UA: NEGATIVE
Protein, UA: NEGATIVE
Spec Grav, UA: 1.015
Urobilinogen, UA: NEGATIVE
pH, UA: 6

## 2012-11-17 LAB — URINE CULTURE

## 2012-11-17 LAB — GC/CHLAMYDIA PROBE AMP
CT Probe RNA: NEGATIVE
GC Probe RNA: NEGATIVE

## 2012-11-17 MED ORDER — OB COMPLETE PETITE 35-5-1-200 MG PO CAPS: 1.0000 | ORAL_CAPSULE | Freq: Every day | ORAL | Status: DC

## 2012-11-17 NOTE — Progress Notes (Signed)
HR - 112 Pt in office today for routine OB visit, states she is having pain to abd and pressure to lower back, states abd muscles are getting very hard, feels like she is having contractions,  Would like a different vitamin that is smaller.  MSAFP today.

## 2012-11-18 ENCOUNTER — Encounter: Payer: Self-pay | Admitting: Obstetrics & Gynecology

## 2012-11-18 LAB — ALPHA FETOPROTEIN, MATERNAL
AFP: 27 IU/mL
MoM for AFP: 0.73
Open Spina bifida: NEGATIVE

## 2012-11-18 NOTE — Patient Instructions (Signed)
Alpha-Fetoprotein Alpha-Fetoprotein (AFP) is a protein made by the fetal liver. It is normally elevated in the newborn and the mother. In the infant, the value falls to adult values (normally less than 20 ng/ml (nanograms per milliliter) by one year of age. This protein is found in a number of abnormal tumors and conditions. It can be used for a screening test when this is appropriate. PREPARATION FOR TEST No preparation or fasting is required. ELEVATIONS OF AFP ARE CAUSED BY:  Primary hepatocellular carcinoma (cancer of the liver) and the level correlates with tumor size.  A screening test for embryonic teratocarcinomas, hepatoblastomas (tumor of the liver).  Rarely hepatic metastases (cancer spread) from the GI tract.  Some cholangiocarcinomas cause elevations greater than 400 ng/ml.  Rapidly progressing hepatitis can produce levels of AFP in excess of 1000ng/ml.  Lesser levels of hepatitis (inflammation of the liver) can produce levels of 100 to 400 ng/ml. NORMAL FINDINGS   Adult: Less than 40 ng/mL or Less than 40 mg/L (SI units).  Child younger than 1 year: Less than 30ng/mL. Ranges are stratified by weeks of gestation and vary among laboratories. Ranges for normal findings may vary among different laboratories and hospitals. You should always check with your doctor after having lab work or other tests done to discuss the meaning of your test results and whether your values are considered within normal limits. MEANING OF TEST  Your caregiver will go over the test results with you and discuss the importance and meaning of your results, as well as treatment options and the need for additional tests if necessary. OBTAINING THE TEST RESULTS  It is your responsibility to obtain your test results. Ask the lab or department performing the test when and how you will get your results. Document Released: 01/16/2004 Document Revised: 03/16/2011 Document Reviewed: 11/27/2007 Exit[employer] Patient  Information 2014 [employer].  

## 2012-11-30 ENCOUNTER — Other Ambulatory Visit: Payer: Self-pay | Admitting: Obstetrics & Gynecology

## 2012-11-30 ENCOUNTER — Ambulatory Visit (HOSPITAL_COMMUNITY)
Admission: RE | Admit: 2012-11-30 | Discharge: 2012-11-30 | Disposition: A | Discharge status: Home / Self Care | Payer: Medicaid Other | Source: Ambulatory Visit | Attending: Obstetrics & Gynecology | Admitting: Obstetrics & Gynecology

## 2012-11-30 ENCOUNTER — Ambulatory Visit (HOSPITAL_COMMUNITY): Payer: Medicaid Other

## 2012-11-30 DIAGNOSIS — Z3689 Encounter for other specified antenatal screening: Secondary | ICD-10-CM

## 2012-11-30 DIAGNOSIS — Z363 Encounter for antenatal screening for malformations: Secondary | ICD-10-CM | POA: Insufficient documentation

## 2012-11-30 DIAGNOSIS — Z1389 Encounter for screening for other disorder: Secondary | ICD-10-CM | POA: Insufficient documentation

## 2012-11-30 DIAGNOSIS — O358XX0 Maternal care for other (suspected) fetal abnormality and damage, not applicable or unspecified: Secondary | ICD-10-CM | POA: Insufficient documentation

## 2012-12-14 ENCOUNTER — Ambulatory Visit (INDEPENDENT_AMBULATORY_CARE_PROVIDER_SITE_OTHER): Payer: Medicaid Other | Admitting: Obstetrics & Gynecology

## 2012-12-14 VITALS — BP 110/76 | Temp 99.1°F | Wt 152.0 lb

## 2012-12-14 DIAGNOSIS — Z3482 Encounter for supervision of other normal pregnancy, second trimester: Secondary | ICD-10-CM

## 2012-12-14 DIAGNOSIS — Z3480 Encounter for supervision of other normal pregnancy, unspecified trimester: Secondary | ICD-10-CM

## 2012-12-14 LAB — POCT URINALYSIS DIPSTICK
Bilirubin, UA: NEGATIVE
Glucose, UA: NEGATIVE
Nitrite, UA: NEGATIVE

## 2012-12-14 NOTE — Progress Notes (Signed)
HR - 88 Pt in office for routine OB visit, states having constipation, approved medications for pregnancy handout given, having sharp low abd pain, and pelvic pain

## 2012-12-19 ENCOUNTER — Inpatient Hospital Stay (HOSPITAL_COMMUNITY)
Admission: AD | Admit: 2012-12-19 | Discharge: 2012-12-19 | Disposition: A | Discharge status: Home / Self Care | Payer: Medicaid Other | Source: Ambulatory Visit | Attending: Obstetrics | Admitting: Obstetrics

## 2012-12-19 ENCOUNTER — Encounter (HOSPITAL_COMMUNITY): Payer: Self-pay | Admitting: *Deleted

## 2012-12-19 DIAGNOSIS — O26899 Other specified pregnancy related conditions, unspecified trimester: Secondary | ICD-10-CM

## 2012-12-19 DIAGNOSIS — R109 Unspecified abdominal pain: Secondary | ICD-10-CM

## 2012-12-19 DIAGNOSIS — M545 Low back pain, unspecified: Secondary | ICD-10-CM | POA: Insufficient documentation

## 2012-12-19 DIAGNOSIS — O99891 Other specified diseases and conditions complicating pregnancy: Secondary | ICD-10-CM | POA: Insufficient documentation

## 2012-12-19 LAB — URINALYSIS, ROUTINE W REFLEX MICROSCOPIC
Glucose, UA: NEGATIVE mg/dL
Nitrite: NEGATIVE
Specific Gravity, Urine: 1.025 (ref 1.005–1.030)
Urobilinogen, UA: 0.2 mg/dL (ref 0.0–1.0)

## 2012-12-19 LAB — URINE MICROSCOPIC-ADD ON

## 2012-12-19 MED ORDER — CYCLOBENZAPRINE HCL 10 MG PO TABS
10.0000 mg | ORAL_TABLET | Freq: Once | ORAL | Status: AC
  Administered 2012-12-19: 10 mg via ORAL
  Filled 2012-12-19: qty 1

## 2012-12-19 MED ORDER — CYCLOBENZAPRINE HCL 10 MG PO TABS: 10.0000 mg | ORAL_TABLET | Freq: Three times a day (TID) | ORAL | Status: DC | PRN

## 2012-12-19 MED ORDER — ACETAMINOPHEN 325 MG PO TABS
650.0000 mg | ORAL_TABLET | Freq: Once | ORAL | Status: AC
  Administered 2012-12-19: 650 mg via ORAL
  Filled 2012-12-19: qty 2
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
## 2012-12-26 ENCOUNTER — Encounter (HOSPITAL_COMMUNITY): Payer: Self-pay | Admitting: *Deleted

## 2012-12-26 ENCOUNTER — Inpatient Hospital Stay (HOSPITAL_COMMUNITY)
Admission: AD | Admit: 2012-12-26 | Discharge: 2012-12-27 | Disposition: A | Discharge status: Home / Self Care | Payer: Medicaid Other | Source: Ambulatory Visit | Attending: Obstetrics & Gynecology | Admitting: Obstetrics & Gynecology

## 2012-12-26 DIAGNOSIS — O99891 Other specified diseases and conditions complicating pregnancy: Secondary | ICD-10-CM | POA: Insufficient documentation

## 2012-12-26 DIAGNOSIS — M549 Dorsalgia, unspecified: Secondary | ICD-10-CM | POA: Insufficient documentation

## 2012-12-26 DIAGNOSIS — N949 Unspecified condition associated with female genital organs and menstrual cycle: Secondary | ICD-10-CM

## 2012-12-26 DIAGNOSIS — R109 Unspecified abdominal pain: Secondary | ICD-10-CM | POA: Insufficient documentation

## 2012-12-26 DIAGNOSIS — M5430 Sciatica, unspecified side: Secondary | ICD-10-CM | POA: Insufficient documentation

## 2012-12-26 LAB — URINALYSIS, ROUTINE W REFLEX MICROSCOPIC
Bilirubin Urine: NEGATIVE
Hgb urine dipstick: NEGATIVE
Ketones, ur: NEGATIVE mg/dL
Protein, ur: NEGATIVE mg/dL
Specific Gravity, Urine: 1.01 (ref 1.005–1.030)
Urobilinogen, UA: 0.2 mg/dL (ref 0.0–1.0)

## 2012-12-26 LAB — URINE MICROSCOPIC-ADD ON

## 2012-12-26 MED ORDER — CYCLOBENZAPRINE HCL 10 MG PO TABS
10.0000 mg | ORAL_TABLET | Freq: Once | ORAL | Status: AC
  Administered 2012-12-26: 10 mg via ORAL
  Filled 2012-12-26: qty 1

## 2012-12-26 MED ORDER — ACETAMINOPHEN 500 MG PO TABS
1000.0000 mg | ORAL_TABLET | Freq: Once | ORAL | Status: AC
  Administered 2012-12-26: 1000 mg via ORAL
  Filled 2012-12-26: qty 2
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
## 2012-12-26 NOTE — MAU Note (Signed)
Pt arrived via EMS and c/o bilateral flank pain but says the left is significantly worse; reports that the pain shoots into left leg and worsens while sitting upright. Tried heat for pain without relief.Denies vag bleeding or discharge

## 2012-12-27 DIAGNOSIS — N949 Unspecified condition associated with female genital organs and menstrual cycle: Secondary | ICD-10-CM

## 2012-12-27 MED ORDER — CYCLOBENZAPRINE HCL 10 MG PO TABS: 10.0000 mg | ORAL_TABLET | Freq: Three times a day (TID) | ORAL | Status: DC | PRN

## 2013-01-05 NOTE — L&D Delivery Note (Signed)
Delivery Note At 12:40 PM a viable female was delivered via Vaginal, Spontaneous Delivery (Presentation: Right Occiput Anterior).  APGAR: 8, 9; weight 6 lb 15 oz (3147 g).   Placenta status: Intact, Spontaneous.  Cord: 3 vessels with the following complications: None.  Cord pH: NA  Anesthesia: Epidural  Episiotomy: None Lacerations: Periurethral Suture Repair: NA Est. Blood Loss (mL): 75  Mom to postpartum.  Baby to Couplet care / Skin to Skin.  [place] 04/11/2013, 1:31 PM

## 2013-01-09 ENCOUNTER — Other Ambulatory Visit: Payer: Self-pay | Admitting: *Deleted

## 2013-01-09 ENCOUNTER — Ambulatory Visit (HOSPITAL_COMMUNITY)
Admission: RE | Admit: 2013-01-09 | Discharge: 2013-01-09 | Disposition: A | Discharge status: Home / Self Care | Payer: Medicaid Other | Source: Ambulatory Visit | Attending: Obstetrics & Gynecology | Admitting: Obstetrics & Gynecology

## 2013-01-09 ENCOUNTER — Inpatient Hospital Stay (HOSPITAL_COMMUNITY)
Admission: AD | Admit: 2013-01-09 | Discharge: 2013-01-09 | Disposition: A | Discharge status: Home / Self Care | Payer: Medicaid Other | Source: Ambulatory Visit | Attending: Obstetrics & Gynecology | Admitting: Obstetrics & Gynecology

## 2013-01-09 ENCOUNTER — Encounter (HOSPITAL_COMMUNITY): Payer: Self-pay | Admitting: *Deleted

## 2013-01-09 ENCOUNTER — Telehealth: Payer: Self-pay | Admitting: *Deleted

## 2013-01-09 ENCOUNTER — Encounter: Payer: Self-pay | Admitting: Obstetrics & Gynecology

## 2013-01-09 ENCOUNTER — Ambulatory Visit (INDEPENDENT_AMBULATORY_CARE_PROVIDER_SITE_OTHER): Payer: Medicaid Other | Admitting: Obstetrics & Gynecology

## 2013-01-09 ENCOUNTER — Other Ambulatory Visit: Payer: Self-pay | Admitting: Obstetrics & Gynecology

## 2013-01-09 VITALS — BP 113/76 | Temp 97.7°F | Wt 154.0 lb

## 2013-01-09 DIAGNOSIS — O4700 False labor before 37 completed weeks of gestation, unspecified trimester: Secondary | ICD-10-CM | POA: Insufficient documentation

## 2013-01-09 DIAGNOSIS — K5900 Constipation, unspecified: Secondary | ICD-10-CM

## 2013-01-09 DIAGNOSIS — O4702 False labor before 37 completed weeks of gestation, second trimester: Secondary | ICD-10-CM

## 2013-01-09 DIAGNOSIS — O402XX1 Polyhydramnios, second trimester, fetus 1: Secondary | ICD-10-CM

## 2013-01-09 DIAGNOSIS — O99891 Other specified diseases and conditions complicating pregnancy: Secondary | ICD-10-CM | POA: Insufficient documentation

## 2013-01-09 DIAGNOSIS — O9989 Other specified diseases and conditions complicating pregnancy, childbirth and the puerperium: Secondary | ICD-10-CM

## 2013-01-09 DIAGNOSIS — O99613 Diseases of the digestive system complicating pregnancy, third trimester: Secondary | ICD-10-CM

## 2013-01-09 DIAGNOSIS — O479 False labor, unspecified: Secondary | ICD-10-CM

## 2013-01-09 DIAGNOSIS — Z3689 Encounter for other specified antenatal screening: Secondary | ICD-10-CM | POA: Insufficient documentation

## 2013-01-09 DIAGNOSIS — O409XX0 Polyhydramnios, unspecified trimester, not applicable or unspecified: Secondary | ICD-10-CM | POA: Insufficient documentation

## 2013-01-09 DIAGNOSIS — Z3480 Encounter for supervision of other normal pregnancy, unspecified trimester: Secondary | ICD-10-CM

## 2013-01-09 LAB — POCT URINALYSIS DIPSTICK
BILIRUBIN UA: NEGATIVE
Glucose, UA: NEGATIVE
Ketones, UA: NEGATIVE
Nitrite, UA: NEGATIVE
PH UA: 8
RBC UA: NEGATIVE
Spec Grav, UA: 1.01
UROBILINOGEN UA: NEGATIVE

## 2013-01-09 MED ORDER — POLYETHYLENE GLYCOL 3350 17 G PO PACK
17.0000 g | PACK | Freq: Once | ORAL | Status: AC
  Administered 2013-01-09: 17 g via ORAL
  Filled 2013-01-09: qty 1

## 2013-01-09 MED ORDER — NIFEDIPINE 10 MG PO CAPS
10.0000 mg | ORAL_CAPSULE | Freq: Three times a day (TID) | ORAL | Status: DC
  Administered 2013-01-09: 10 mg via ORAL
  Filled 2013-01-09: qty 1

## 2013-01-09 NOTE — Progress Notes (Signed)
HR 108 Pt in office today for problem visit, reports painful contractions all day Sunday, unable to sleep at night, pain radiates into sides , reports nausea and pressure in back, reports increased watery vaginal discharge, denies odor.  Constipated.  Encouraged use of po laxative/stool softener.  U/S by me: cx >3 cm in length; small funnel.  [facility]

## 2013-01-09 NOTE — MAU Provider Note (Signed)
History     CSN: [id number]  Arrival date and time: 01/09/13 1753   First Provider Initiated Contact with Patient 01/09/13 1855      Chief Complaint  Patient presents with  . Labor Eval   HPI This is a 23 y.o. female at 5969w6d who presents following ultrasound for cervical length measurement. Has had contractions today   They are better now than they were earlier Has had constipation with last BM sometime last week. States has always been like that. Cannot remember when last one was.  No leaking or bleeding.   RN Note: Patient states she was in the office today and was sent for an ultrasound for a cervical length. Patient states she is having contractions every 15-20 minutes. Denies bleeding or leaking fluid. Reports good fetal movement.        OB History   Grav Para Term Preterm Abortions TAB SAB Ect Mult Living   4 1 1  2 1 1   1       Past Medical History  Diagnosis Date  . H/O abdominal surgery   . Endometriosis   . Other and unspecified ovarian cysts   . UTI (lower urinary tract infection)   . Chlamydia     Past Surgical History  Procedure Laterality Date  . Tonsillectomy    . Abdominal exploration surgery  2013    to remove cysts & adhesions  . Adenoidectomy      Family History  Problem Relation Age of Onset  . Hypertension Mother   . Diabetes Mother   . Hypertension Father   . Diabetes Father     History  Substance Use Topics  . Smoking status: Never Smoker   . Smokeless tobacco: Not on file  . Alcohol Use: No    Allergies:  Allergies  Allergen Reactions  . Doxycycline Other (See Comments)    Causes abdominal muscle spasms  . Morphine And Related Nausea And Vomiting  . Latex Rash  . Percocet [Oxycodone-Acetaminophen] Rash    Prescriptions prior to admission  Medication Sig Dispense Refill  . cyclobenzaprine (FLEXERIL) 10 MG tablet Take 1 tablet (10 mg total) by mouth 3 (three) times daily as needed.  30 tablet  0  . Prenatal Vit-Fe  Fumarate-FA (PRENATAL MULTIVITAMIN) TABS tablet Take 1 tablet by mouth daily at 12 noon.        Review of Systems  Constitutional: Negative for fever, chills and malaise/fatigue.  Gastrointestinal: Positive for abdominal pain (very mild contractions ) and constipation. Negative for nausea, vomiting and diarrhea.  Genitourinary: Negative for dysuria.  Neurological: Negative for dizziness, weakness and headaches.   Physical Exam   Blood pressure 111/71, pulse 103, resp. rate 20, height 5' 2.5" (1.588 m), weight 72.213 kg (159 lb 3.2 oz), last menstrual period 07/26/2012, SpO2 100.00%.  Physical Exam  Constitutional: She is oriented to person, place, and time. She appears well-developed and well-nourished. No distress.  HENT:  Head: Normocephalic.  Cardiovascular: Normal rate.   Respiratory: Effort normal.  GI: Soft. She exhibits no distension. There is no tenderness. There is no rebound and no guarding.  Genitourinary:  Examined at office Thought to be closed   Musculoskeletal: Normal range of motion.  Neurological: She is alert and oriented to person, place, and time.  Skin: Skin is warm and dry.  Psychiatric: She has a normal mood and affect.   Fetal heart rate stable and reassuring c/w 24 wks UCs every 10-15 minutes  MAU Course  Procedures  MDM Discussed with Dr Tamela Oddi. May give some Procardia but really needs to use laxative which will probably help contractions. >>  UCs almost totally stopped  US showed cervical length of 2.74cm with polyhydramnios , EFW 53%  Assessment and Plan  A:  SIUP at [redacted]w[redacted]d        Preterm contractions with good cervical length       Polyhydramnios  P:  WIll try procardia then discharge home       Pt may try other laxative at home      FOllowup in office  [facility] 01/09/2013, 7:16 PM

## 2013-01-09 NOTE — Discharge Instructions (Signed)

## 2013-01-09 NOTE — Telephone Encounter (Signed)
LM on VM making pt aware that she needs to call office to see Tamela OddiJackson-Moore today or be seen in MAU for contractions.

## 2013-01-09 NOTE — MAU Note (Signed)
Patient states she was in the office today and was sent for an ultrasound for a cervical length. Patient states she is having contractions every 15-20 minutes. Denies bleeding or leaking fluid. Reports good fetal movement.

## 2013-01-10 ENCOUNTER — Encounter: Payer: Self-pay | Admitting: Obstetrics & Gynecology

## 2013-01-10 DIAGNOSIS — O409XX0 Polyhydramnios, unspecified trimester, not applicable or unspecified: Secondary | ICD-10-CM | POA: Insufficient documentation

## 2013-01-10 LAB — URINALYSIS, ROUTINE W REFLEX MICROSCOPIC
Bilirubin Urine: NEGATIVE
Glucose, UA: NEGATIVE mg/dL
HGB URINE DIPSTICK: NEGATIVE
Ketones, ur: NEGATIVE mg/dL
NITRITE: NEGATIVE
PROTEIN: NEGATIVE mg/dL
Specific Gravity, Urine: 1.021 (ref 1.005–1.030)
Urobilinogen, UA: 1 mg/dL (ref 0.0–1.0)
pH: 8 (ref 5.0–8.0)

## 2013-01-10 LAB — US OB DETAIL + 14 WK

## 2013-01-10 LAB — URINALYSIS, MICROSCOPIC ONLY
Bacteria, UA: NONE SEEN
Casts: NONE SEEN
Crystals: NONE SEEN

## 2013-01-10 LAB — FETAL FIBRONECTIN: Fetal Fibronectin: NEGATIVE

## 2013-01-10 NOTE — Patient Instructions (Signed)

## 2013-01-11 ENCOUNTER — Other Ambulatory Visit: Payer: Medicaid Other

## 2013-01-11 ENCOUNTER — Ambulatory Visit (INDEPENDENT_AMBULATORY_CARE_PROVIDER_SITE_OTHER): Payer: Medicaid Other | Admitting: Obstetrics & Gynecology

## 2013-01-11 ENCOUNTER — Encounter: Payer: Medicaid Other | Admitting: Obstetrics & Gynecology

## 2013-01-11 VITALS — BP 127/73 | Temp 98.9°F | Wt 159.0 lb

## 2013-01-11 DIAGNOSIS — Z3482 Encounter for supervision of other normal pregnancy, second trimester: Secondary | ICD-10-CM

## 2013-01-11 DIAGNOSIS — Z3480 Encounter for supervision of other normal pregnancy, unspecified trimester: Secondary | ICD-10-CM

## 2013-01-11 LAB — POCT URINALYSIS DIPSTICK
SPEC GRAV UA: 1.015
pH, UA: 6

## 2013-01-11 LAB — URINE CULTURE: Colony Count: 75000

## 2013-01-11 LAB — STREP B DNA PROBE: GBSP: NEGATIVE

## 2013-01-11 NOTE — Progress Notes (Signed)
Pulse 123 Pt states that she had caught a stomach bug from her daughter.  Pt has been having N/V/D for the past few days.  Pt is feeling drained and tired.  Pt states that she has been able to keep some fluids down.  Pt states that she is still having some contractions after being given Procardia when seen in MAU.

## 2013-01-11 NOTE — Patient Instructions (Signed)
Glucose Tolerance Test This is a test to see how your body processes carbohydrates. This test is often done to check patients for diabetes or the possibility of developing it. PREPARATION FOR TEST You should have nothing to eat or drink 12 hours before the test. You will be given a form of sugar (glucose) and then blood samples will be drawn from your vein to determine the level of sugar in your blood. Alternatively, blood may be drawn from your finger for testing. You should not smoke or exercise during the test. NORMAL FINDINGS  Fasting: 70-115 mg/dL  30 minutes: less than 200 mg/dL  1 hour: less than 200 mg/dL  2 hours: less than 140 mg/dL  3 hours: 70-115 mg/dL  4 hours: 70-115 mg/dL Ranges for normal findings may vary among different laboratories and hospitals. You should always check with your doctor after having lab work or other tests done to discuss the meaning of your test results and whether your values are considered within normal limits. MEANING OF TEST Your caregiver will go over the test results with you and discuss the importance and meaning of your results, as well as treatment options and the need for additional tests. OBTAINING THE TEST RESULTS It is your responsibility to obtain your test results. Ask the lab or department performing the test when and how you will get your results. Document Released: 01/15/2004 Document Revised: 03/16/2011 Document Reviewed: 12/03/2007 Exit[employer] Patient Information 2014 [employer].  

## 2013-01-16 ENCOUNTER — Encounter: Payer: Medicaid Other | Admitting: Obstetrics & Gynecology

## 2013-01-16 ENCOUNTER — Other Ambulatory Visit: Payer: Medicaid Other

## 2013-01-19 ENCOUNTER — Ambulatory Visit: Payer: Medicaid Other | Admitting: Obstetrics & Gynecology

## 2013-02-01 ENCOUNTER — Ambulatory Visit (INDEPENDENT_AMBULATORY_CARE_PROVIDER_SITE_OTHER): Payer: Medicaid Other | Admitting: Obstetrics & Gynecology

## 2013-02-01 VITALS — BP 117/76 | Temp 99.0°F | Wt 163.0 lb

## 2013-02-01 DIAGNOSIS — Z3480 Encounter for supervision of other normal pregnancy, unspecified trimester: Secondary | ICD-10-CM

## 2013-02-01 LAB — POCT URINALYSIS DIPSTICK
Bilirubin, UA: NEGATIVE
Blood, UA: NEGATIVE
GLUCOSE UA: NEGATIVE
KETONES UA: NEGATIVE
Nitrite, UA: NEGATIVE
SPEC GRAV UA: 1.015
Urobilinogen, UA: NEGATIVE
pH, UA: 6

## 2013-02-01 NOTE — Progress Notes (Signed)
Pulse: 103 Patient states she is having a lot of pelvic pressure. Patient states she is having a lot of thick discharge. Patient states she has a constant headache.

## 2013-02-02 ENCOUNTER — Other Ambulatory Visit: Payer: Medicaid Other

## 2013-02-02 NOTE — Patient Instructions (Signed)
Third Trimester of Pregnancy  The third trimester is from week 29 through week 42, months 7 through 9. The third trimester is a time when the fetus is growing rapidly. At the end of the ninth month, the fetus is about 20 inches in length and weighs 6 10 pounds.   BODY CHANGES  Your body goes through many changes during pregnancy. The changes vary from woman to woman.    Your weight will continue to increase. You can expect to gain 25 35 pounds (11 16 kg) by the end of the pregnancy.   You may begin to get stretch marks on your hips, abdomen, and breasts.   You may urinate more often because the fetus is moving lower into your pelvis and pressing on your bladder.   You may develop or continue to have heartburn as a result of your pregnancy.   You may develop constipation because certain hormones are causing the muscles that push waste through your intestines to slow down.   You may develop hemorrhoids or swollen, bulging veins (varicose veins).   You may have pelvic pain because of the weight gain and pregnancy hormones relaxing your joints between the bones in your pelvis. Back aches may result from over exertion of the muscles supporting your posture.   Your breasts will continue to grow and be tender. A yellow discharge may leak from your breasts called colostrum.   Your belly button may stick out.   You may feel short of breath because of your expanding uterus.   You may notice the fetus "dropping," or moving lower in your abdomen.   You may have a bloody mucus discharge. This usually occurs a few days to a week before labor begins.   Your cervix becomes thin and soft (effaced) near your due date.  WHAT TO EXPECT AT YOUR PRENATAL EXAMS   You will have prenatal exams every 2 weeks until week 36. Then, you will have weekly prenatal exams. During a routine prenatal visit:   You will be weighed to make sure you and the fetus are growing normally.   Your blood pressure is taken.   Your abdomen will be  measured to track your baby's growth.   The fetal heartbeat will be listened to.   Any test results from the previous visit will be discussed.   You may have a cervical check near your due date to see if you have effaced.  At around 36 weeks, your caregiver will check your cervix. At the same time, your caregiver will also perform a test on the secretions of the vaginal tissue. This test is to determine if a type of bacteria, Group B streptococcus, is present. Your caregiver will explain this further.  Your caregiver may ask you:   What your birth plan is.   How you are feeling.   If you are feeling the baby move.   If you have had any abnormal symptoms, such as leaking fluid, bleeding, severe headaches, or abdominal cramping.   If you have any questions.  Other tests or screenings that may be performed during your third trimester include:   Blood tests that check for low iron levels (anemia).   Fetal testing to check the health, activity level, and growth of the fetus. Testing is done if you have certain medical conditions or if there are problems during the pregnancy.  FALSE LABOR  You may feel small, irregular contractions that eventually go away. These are called Braxton Hicks contractions, or   false labor. Contractions may last for hours, days, or even weeks before true labor sets in. If contractions come at regular intervals, intensify, or become painful, it is best to be seen by your caregiver.   SIGNS OF LABOR    Menstrual-like cramps.   Contractions that are 5 minutes apart or less.   Contractions that start on the top of the uterus and spread down to the lower abdomen and back.   A sense of increased pelvic pressure or back pain.   A watery or bloody mucus discharge that comes from the vagina.  If you have any of these signs before the 37th week of pregnancy, call your caregiver right away. You need to go to the hospital to get checked immediately.  HOME CARE INSTRUCTIONS    Avoid all  smoking, herbs, alcohol, and unprescribed drugs. These chemicals affect the formation and growth of the baby.   Follow your caregiver's instructions regarding medicine use. There are medicines that are either safe or unsafe to take during pregnancy.   Exercise only as directed by your caregiver. Experiencing uterine cramps is a good sign to stop exercising.   Continue to eat regular, healthy meals.   Wear a good support bra for breast tenderness.   Do not use hot tubs, steam rooms, or saunas.   Wear your seat belt at all times when driving.   Avoid raw meat, uncooked cheese, cat litter boxes, and soil used by cats. These carry germs that can cause birth defects in the baby.   Take your prenatal vitamins.   Try taking a stool softener (if your caregiver approves) if you develop constipation. Eat more high-fiber foods, such as fresh vegetables or fruit and whole grains. Drink plenty of fluids to keep your urine clear or pale yellow.   Take warm sitz baths to soothe any pain or discomfort caused by hemorrhoids. Use hemorrhoid cream if your caregiver approves.   If you develop varicose veins, wear support hose. Elevate your feet for 15 minutes, 3 4 times a day. Limit salt in your diet.   Avoid heavy lifting, wear low heal shoes, and practice good posture.   Rest a lot with your legs elevated if you have leg cramps or low back pain.   Visit your dentist if you have not gone during your pregnancy. Use a soft toothbrush to brush your teeth and be gentle when you floss.   A sexual relationship may be continued unless your caregiver directs you otherwise.   Do not travel far distances unless it is absolutely necessary and only with the approval of your caregiver.   Take prenatal classes to understand, practice, and ask questions about the labor and delivery.   Make a trial run to the hospital.   Pack your hospital bag.   Prepare the baby's nursery.   Continue to go to all your prenatal visits as directed  by your caregiver.  SEEK MEDICAL CARE IF:   You are unsure if you are in labor or if your water has broken.   You have dizziness.   You have mild pelvic cramps, pelvic pressure, or nagging pain in your abdominal area.   You have persistent nausea, vomiting, or diarrhea.   You have a bad smelling vaginal discharge.   You have pain with urination.  SEEK IMMEDIATE MEDICAL CARE IF:    You have a fever.   You are leaking fluid from your vagina.   You have spotting or bleeding from your vagina.     You have severe abdominal cramping or pain.   You have rapid weight loss or gain.   You have shortness of breath with chest pain.   You notice sudden or extreme swelling of your face, hands, ankles, feet, or legs.   You have not felt your baby move in over an hour.   You have severe headaches that do not go away with medicine.   You have vision changes.  Document Released: 12/16/2000 Document Revised: 08/24/2012 Document Reviewed: 02/23/2012  [employer] Patient Information 2014 [employer].

## 2013-02-04 ENCOUNTER — Inpatient Hospital Stay (HOSPITAL_COMMUNITY): Payer: Medicaid Other

## 2013-02-04 ENCOUNTER — Encounter (HOSPITAL_COMMUNITY): Payer: Self-pay | Admitting: *Deleted

## 2013-02-04 ENCOUNTER — Inpatient Hospital Stay (HOSPITAL_COMMUNITY)
Admission: AD | Admit: 2013-02-04 | Discharge: 2013-02-04 | Disposition: A | Discharge status: Home / Self Care | Payer: Medicaid Other | Source: Ambulatory Visit | Attending: Obstetrics & Gynecology | Admitting: Obstetrics & Gynecology

## 2013-02-04 DIAGNOSIS — O4700 False labor before 37 completed weeks of gestation, unspecified trimester: Secondary | ICD-10-CM | POA: Insufficient documentation

## 2013-02-04 DIAGNOSIS — R209 Unspecified disturbances of skin sensation: Secondary | ICD-10-CM | POA: Insufficient documentation

## 2013-02-04 DIAGNOSIS — O409XX0 Polyhydramnios, unspecified trimester, not applicable or unspecified: Secondary | ICD-10-CM | POA: Insufficient documentation

## 2013-02-04 DIAGNOSIS — O479 False labor, unspecified: Secondary | ICD-10-CM

## 2013-02-04 DIAGNOSIS — O26879 Cervical shortening, unspecified trimester: Secondary | ICD-10-CM | POA: Insufficient documentation

## 2013-02-04 DIAGNOSIS — R109 Unspecified abdominal pain: Secondary | ICD-10-CM | POA: Insufficient documentation

## 2013-02-04 LAB — URINALYSIS, ROUTINE W REFLEX MICROSCOPIC
Bilirubin Urine: NEGATIVE
GLUCOSE, UA: NEGATIVE mg/dL
Hgb urine dipstick: NEGATIVE
Ketones, ur: NEGATIVE mg/dL
Nitrite: NEGATIVE
PH: 6 (ref 5.0–8.0)
Protein, ur: NEGATIVE mg/dL
Specific Gravity, Urine: 1.01 (ref 1.005–1.030)
Urobilinogen, UA: 0.2 mg/dL (ref 0.0–1.0)

## 2013-02-04 LAB — URINE MICROSCOPIC-ADD ON

## 2013-02-04 LAB — FETAL FIBRONECTIN: FETAL FIBRONECTIN: NEGATIVE

## 2013-02-04 MED ORDER — TRAMADOL HCL 50 MG PO TABS: 50.0000 mg | ORAL_TABLET | Freq: Four times a day (QID) | ORAL | Status: DC | PRN

## 2013-02-04 MED ORDER — TRAMADOL HCL 50 MG PO TABS
50.0000 mg | ORAL_TABLET | Freq: Once | ORAL | Status: AC
  Administered 2013-02-04: 50 mg via ORAL
  Filled 2013-02-04: qty 1

## 2013-02-04 MED ORDER — NIFEDIPINE 10 MG PO CAPS: 10.0000 mg | ORAL_CAPSULE | Freq: Four times a day (QID) | ORAL | Status: DC | PRN

## 2013-02-04 MED ORDER — NIFEDIPINE 10 MG PO CAPS
10.0000 mg | ORAL_CAPSULE | Freq: Once | ORAL | Status: AC
  Administered 2013-02-04: 10 mg via ORAL
  Filled 2013-02-04: qty 1

## 2013-02-04 NOTE — MAU Provider Note (Signed)
History     CSN: [id number]  Arrival date and time: 02/04/13 1427   First Provider Initiated Contact with Patient 02/04/13 1519      Chief Complaint  Patient presents with  . Abdominal Pain  . Swelling in hands and feet    HPI This is a 23 y.o. female at 1072w4d who presents with c/o cramping like a period. Also has some tingling in her fingers. States they are swollen. Denies leaking or bleeding. Seen several weeks ago for cramping, noted to have shortened cervix. FFn neg then.   RN Note: Pt states couldn't sleep last pm r/t abd pain. Has had tingling in feet and fingers since this am. Hands and feet are swollen.        OB History   Grav Para Term Preterm Abortions TAB SAB Ect Mult Living   4 1 1  2 1 1   1       Past Medical History  Diagnosis Date  . H/O abdominal surgery   . Endometriosis   . Other and unspecified ovarian cysts   . UTI (lower urinary tract infection)   . Chlamydia     Past Surgical History  Procedure Laterality Date  . Tonsillectomy    . Abdominal exploration surgery  2013    to remove cysts & adhesions  . Adenoidectomy      Family History  Problem Relation Age of Onset  . Hypertension Mother   . Diabetes Mother   . Hypertension Father   . Diabetes Father     History  Substance Use Topics  . Smoking status: Never Smoker   . Smokeless tobacco: Not on file  . Alcohol Use: No    Allergies:  Allergies  Allergen Reactions  . Doxycycline Other (See Comments)    Causes abdominal muscle spasms  . Morphine And Related Nausea And Vomiting  . Latex Rash  . Percocet [Oxycodone-Acetaminophen] Rash    No prescriptions prior to admission    Review of Systems  Constitutional: Negative for fever, chills and malaise/fatigue.  Gastrointestinal: Positive for abdominal pain and diarrhea (more frequent stools, several per day, used to only go twice a week). Negative for nausea, vomiting and constipation.  Genitourinary: Negative for dysuria.   Neurological: Negative for dizziness and headaches.  Psychiatric/Behavioral: Negative for depression.   Physical Exam   Blood pressure 125/70, pulse 98, temperature 98.8 F (37.1 C), temperature source Oral, resp. rate 18, height 5\' 1"  (1.549 m).  Physical Exam  Constitutional: She is oriented to person, place, and time. She appears well-developed and well-nourished. No distress.  HENT:  Head: Normocephalic.  Cardiovascular: Normal rate.   Respiratory: Effort normal.  GI: Soft. She exhibits no distension. There is no tenderness. There is no rebound and no guarding.  Genitourinary: Uterus normal. Vaginal discharge (mucous discharge, negative pooling, negative ferning) found.  Musculoskeletal: Normal range of motion.  Neurological: She is alert and oriented to person, place, and time.  Skin: Skin is warm and dry.  Psychiatric: She has a normal mood and affect.   Fetal heart rate reassuring Uterine irritability noted.   MAU Course  Procedures  MDM Given Procardia and Tramadol.  Discussed with Dr Clearance CootsHarper  >>  UC s probably related to polyhydramnios.  Assessment and Plan  A:  SIUP at 242w4d      Preterm contractions with negative FFn and no cervical change (length has improved)      Polyhydramnios  P;  Discharge home  Rx Procardia and Tramadol for PRN use      Per Dr Clearance Coots, pt to be seen in office Monday  [facility] 02/04/2013, 3:38 PM

## 2013-02-04 NOTE — Discharge Instructions (Signed)
°Fetal Fibronectin °This is a test done to help evaluate a pregnant woman's risk of pre-term delivery. It is generally done when you are 26 to [redacted] weeks pregnant and are having symptoms of premature labor. A Dacron swab is used to take a sample of cervical or vaginal fluid from the back portion of the vagina or from the area just outside the opening of the cervix. °Fetal fibronectin (fFN) is a glycoprotein that can be used to help predict the short term risk of premature delivery. fFN is produced at the boundary between the amniotic sac and the lining of the mother's uterus. This is called the unteroplacental junction. Fetal fibronectin is largely confined to this junction and thought to help maintain the integrity of the boundary. fFN is normally detectable in cervicovaginal fluid during the first 20 to 24 weeks of pregnancy, and then is detectable again after about 36 weeks.  °Finding fFN in cervicovaginal fluids after 36 weeks is not unusual as it is often released by the body as it gets ready for childbirth. The elevated fFN found in vaginal fluids early in pregnancy may simply reflect the normal growth and establishment of tissues at the unteroplacental junction with levels falling when this phase is complete. What is known is that fFN that is detected between 24 and 36 weeks of pregnancy is not normal. Elevated levels reflect a disturbance at the uteroplacental junction and have been associated with an increased risk of pre-term labor and delivery. Knowing whether or not a woman is likely to deliver prematurely helps your caregiver plan a course of action. The fFN test is a relatively non-invasive tool to help the caregiver to distinguish between those who are likely to deliver shortly and those who are not.  °PREPARATION FOR TEST  °· Inform the person conducting the test if you have a medical condition or are using any medications that cause excessive bleeding. °· Do not have sexual intercourse for 24 hours  before the procedure. °NORMAL FINDINGS  °Pregnancy = 50 nanograms/ml °Ranges for normal findings may vary among different laboratories and hospitals. You should always check with your doctor after having lab work or other tests done to discuss the meaning of your test results and whether your values are considered within normal limits. °MEANING OF TEST  °Your caregiver will go over the test results with you and discuss the importance and meaning of your results, as well as treatment options and the need for additional tests if necessary. °OBTAINING THE TEST RESULTS  °It is your responsibility to obtain your test results. Ask the lab or department performing the test when and how you will get your results. °Document Released: 10/24/2003 Document Revised: 03/16/2011 Document Reviewed: 12/02/2007 °Exit[employer]® Patient Information ©2014 [employer]. °Preterm Labor Information °Preterm labor is when labor starts at less than 37 weeks of pregnancy. The normal length of a pregnancy is 39 to 41 weeks. °CAUSES °Often, there is no identifiable underlying cause as to why a woman goes into preterm labor. One of the most common known causes of preterm labor is infection. Infections of the uterus, cervix, vagina, amniotic sac, bladder, kidney, or even the lungs (pneumonia) can cause labor to start. Other suspected causes of preterm labor include:  °· Urogenital infections, such as yeast infections and bacterial vaginosis.   °· Uterine abnormalities (uterine shape, uterine septum, fibroids, or bleeding from the placenta).   °· A cervix that has been operated on (it may fail to stay closed).   °· Malformations in the fetus.   °·   Multiple gestations (twins, triplets, and so on).   °· Breakage of the amniotic sac.   °RISK FACTORS °· Having a previous history of preterm labor.   °· Having premature rupture of membranes (PROM).   °· Having a placenta that covers the opening of the cervix (placenta previa).   °· Having a placenta that  separates from the uterus (placental abruption).   °· Having a cervix that is too weak to hold the fetus in the uterus (incompetent cervix).   °· Having too much fluid in the amniotic sac (polyhydramnios).   °· Taking illegal drugs or smoking while pregnant.   °· Not gaining enough weight while pregnant.   °· Being younger than 18 and older than 24 years old.   °· Having a low socioeconomic status.   °· Being African American. °SYMPTOMS °Signs and symptoms of preterm labor include:  °· Menstrual-like cramps, abdominal pain, or back pain. °· Uterine contractions that are regular, as frequent as six in an hour, regardless of their intensity (may be mild or painful). °· Contractions that start on the top of the uterus and spread down to the lower abdomen and back.   °· A sense of increased pelvic pressure.   °· A watery or bloody mucus discharge that comes from the vagina.   °TREATMENT °Depending on the length of the pregnancy and other circumstances, your health care provider may suggest bed rest. If necessary, there are medicines that can be given to stop contractions and to mature the fetal lungs. If labor happens before 34 weeks of pregnancy, a prolonged hospital stay may be recommended. Treatment depends on the condition of both you and the fetus.  °WHAT SHOULD YOU DO IF YOU THINK YOU ARE IN PRETERM LABOR? °Call your health care provider right away. You will need to go to the hospital to get checked immediately. °HOW CAN YOU PREVENT PRETERM LABOR IN FUTURE PREGNANCIES? °You should:  °· Stop smoking if you smoke.  °· Maintain healthy weight gain and avoid chemicals and drugs that are not necessary. °· Be watchful for any type of infection. °· Inform your health care provider if you have a known history of preterm labor. °Document Released: 03/14/2003 Document Revised: 08/24/2012 Document Reviewed: 01/25/2012 °ExitCare® Patient Information ©2014 [employer]. ° °

## 2013-02-06 ENCOUNTER — Other Ambulatory Visit: Payer: Medicaid Other

## 2013-02-06 ENCOUNTER — Ambulatory Visit (INDEPENDENT_AMBULATORY_CARE_PROVIDER_SITE_OTHER): Payer: Medicaid Other | Admitting: Obstetrics & Gynecology

## 2013-02-06 VITALS — BP 122/78 | Temp 97.4°F | Wt 167.0 lb

## 2013-02-06 DIAGNOSIS — Z3480 Encounter for supervision of other normal pregnancy, unspecified trimester: Secondary | ICD-10-CM

## 2013-02-06 DIAGNOSIS — O4700 False labor before 37 completed weeks of gestation, unspecified trimester: Secondary | ICD-10-CM

## 2013-02-06 DIAGNOSIS — O479 False labor, unspecified: Secondary | ICD-10-CM

## 2013-02-06 DIAGNOSIS — Z23 Encounter for immunization: Secondary | ICD-10-CM

## 2013-02-06 LAB — POCT URINALYSIS DIPSTICK
Bilirubin, UA: NEGATIVE
Blood, UA: NEGATIVE
Glucose, UA: NEGATIVE
KETONES UA: NEGATIVE
Nitrite, UA: NEGATIVE
PROTEIN UA: NEGATIVE
Urobilinogen, UA: NEGATIVE
pH, UA: 8

## 2013-02-06 NOTE — Patient Instructions (Signed)
Preterm Labor Information Preterm labor is when labor starts at less than 37 weeks of pregnancy. The normal length of a pregnancy is 39 to 41 weeks. CAUSES Often, there is no identifiable underlying cause as to why a woman goes into preterm labor. One of the most common known causes of preterm labor is infection. Infections of the uterus, cervix, vagina, amniotic sac, bladder, kidney, or even the lungs (pneumonia) can cause labor to start. Other suspected causes of preterm labor include:   Urogenital infections, such as yeast infections and bacterial vaginosis.   Uterine abnormalities (uterine shape, uterine septum, fibroids, or bleeding from the placenta).   A cervix that has been operated on (it may fail to stay closed).   Malformations in the fetus.   Multiple gestations (twins, triplets, and so on).   Breakage of the amniotic sac.  RISK FACTORS  Having a previous history of preterm labor.   Having premature rupture of membranes (PROM).   Having a placenta that covers the opening of the cervix (placenta previa).   Having a placenta that separates from the uterus (placental abruption).   Having a cervix that is too weak to hold the fetus in the uterus (incompetent cervix).   Having too much fluid in the amniotic sac (polyhydramnios).   Taking illegal drugs or smoking while pregnant.   Not gaining enough weight while pregnant.   Being younger than 73 and older than 24 years old.   Having a low socioeconomic status.   Being African American. SYMPTOMS Signs and symptoms of preterm labor include:   Menstrual-like cramps, abdominal pain, or back pain.  Uterine contractions that are regular, as frequent as six in an hour, regardless of their intensity (may be mild or painful).  Contractions that start on the top of the uterus and spread down to the lower abdomen and back.   A sense of increased pelvic pressure.   A watery or bloody mucus discharge that  comes from the vagina.  TREATMENT Depending on the length of the pregnancy and other circumstances, your health care provider may suggest bed rest. If necessary, there are medicines that can be given to stop contractions and to mature the fetal lungs. If labor happens before 34 weeks of pregnancy, a prolonged hospital stay may be recommended. Treatment depends on the condition of both you and the fetus.  WHAT SHOULD YOU DO IF YOU THINK YOU ARE IN PRETERM LABOR? Call your health care provider right away. You will need to go to the hospital to get checked immediately. HOW CAN YOU PREVENT PRETERM LABOR IN FUTURE PREGNANCIES? You should:   Stop smoking if you smoke.  Maintain healthy weight gain and avoid chemicals and drugs that are not necessary.  Be watchful for any type of infection.  Inform your health care provider if you have a known history of preterm labor. Document Released: 03/14/2003 Document Revised: 08/24/2012 Document Reviewed: 01/25/2012 [facility] Patient Information 2014 [place], [place]. Tetanus, Diphtheria (Td) Vaccine What You Need to Know WHY GET VACCINATED? Tetanus  and diphtheria are very serious diseases. They are rare in the [place] today, but people who do become infected often have severe complications. Td vaccine is used to protect adolescents and adults from both of these diseases. Both tetanus and diphtheria are infections caused by bacteria. Diphtheria spreads from person to person through coughing or sneezing. Tetanus-causing bacteria enter the body through cuts, scratches, or wounds. TETANUS (Lockjaw) causes painful muscle tightening and stiffness, usually all over the body.  It can lead to tightening of muscles in the head and neck so you can't open your mouth, swallow, or sometimes even breathe. Tetanus kills about 1 out of every 5 people who are infected. DIPHTHERIA can cause a thick coating to form in the back of the throat.  It can lead to breathing  problems, paralysis, heart failure, and death. Before vaccines, the [place] saw as many as 200,000 cases a year of diphtheria and hundreds of cases of tetanus. Since vaccination began, cases of both diseases have dropped by about 99%. TD VACCINE Td vaccine can protect adolescents and adults from tetanus and diphtheria. Td is usually given as a booster dose every 10 years but it can also be given earlier after a severe and dirty wound or burn. Your doctor can give you more information. Td may safely be given at the same time as other vaccines. SOME PEOPLE SHOULD NOT GET THIS VACCINE  If you ever had a life-threatening allergic reaction after a dose of any tetanus or diphtheria containing vaccine, OR if you have a severe allergy to any part of this vaccine, you should not get Td. Tell your doctor if you have any severe allergies.  Talk to your doctor if you:  have epilepsy or another nervous system problem,  had severe pain or swelling after any vaccine containing diphtheria or tetanus,  ever had Guillain Barr Syndrome (GBS),  aren't feeling well on the day the shot is scheduled. RISKS OF A VACCINE REACTION With a vaccine, like any medicine, there is a chance of side effects. These are usually mild and go away on their own. Serious side effects are also possible, but are very rare. Most people who get Td vaccine do not have any problems with it. Mild Problems  following Td (Did not interfere with activities)  Pain where the shot was given (about 8 people in 10)  Redness or swelling where the shot was given (about 1 person in 3)  Mild fever (about 1 person in 15)  Headache or Tiredness (uncommon) Moderate Problems following Td (Interfered with activities, but did not require medical attention)  Fever over 102 F (38.9 C) (rare) Severe Problems  following Td (Unable to perform usual activities; required medical attention)  Swelling, severe pain, bleeding, or redness in the  arm where the shot was given (rare). Problems that could happen after any vaccine:  Brief fainting spells can happen after any medical procedure, including vaccination. Sitting or lying down for about 15 minutes can help prevent fainting, and injuries caused by a fall. Tell your doctor if you feel dizzy, or have vision changes or ringing in the ears.  Severe shoulder pain and reduced range of motion in the arm where a shot was given can happen, very rarely, after a vaccination.  Severe allergic reactions from a vaccine are very rare, estimated at less than 1 in a million doses. If one were to occur, it would usually be within a few minutes to a few hours after the vaccination. WHAT IF THERE IS A SERIOUS REACTION? What should I look for?  Look for anything that concerns you, such as signs of a severe allergic reaction, very high fever, or behavior changes. Signs of a severe allergic reaction can include hives, swelling of the face and throat, difficulty breathing, a fast heartbeat, dizziness, and weakness. These would usually start a few minutes to a few hours after the vaccination. What should I do?  If you think it is a  severe allergic reaction or other emergency that can't wait, call 911 or get the person to the nearest hospital. Otherwise, call your doctor.  Afterward, the reaction should be reported to the Vaccine Adverse Event Reporting System (VAERS). Your doctor might file this report, or, you can do it yourself through the VAERS website or by calling 1-337-673-7396. VAERS is only for reporting reactions. They do not give medical advice. THE NATIONAL VACCINE INJURY COMPENSATION PROGRAM The National Vaccine Injury Compensation Program (VICP) is a federal program that was created to compensate people who may have been injured by certain vaccines. Persons who believe they may have been injured by a vaccine can learn about the program and about filing a claim by calling 1-813-545-3432 or  visiting the [facility] website. HOW CAN I LEARN MORE?  Ask your doctor.  Contact your local or state health department.  Contact the Centers for Disease Control and Prevention (CDC):  Call 845-866-2840 (1-800-CDC-INFO)  Visit CDC's vaccines website CDC Td Vaccine Interim VIS (02/09/12) Document Released: 10/19/2005 Document Revised: 04/18/2012 Document Reviewed: 04/13/2012 [facility] Patient Information 2014 [place], [place].

## 2013-02-06 NOTE — Addendum Note (Signed)
Addended by: Marya LandryFOSTER, Earnie Bechard D on: 02/06/2013 11:46 AM   Modules accepted: Orders

## 2013-02-06 NOTE — Progress Notes (Signed)
Pulse: 93  Patient states she is having lower abdominal pressure. Patient states she having sharp pain right below breast bone to right above stomach. Patient states it is hard for her to breathe.

## 2013-02-07 ENCOUNTER — Other Ambulatory Visit: Payer: Medicaid Other

## 2013-02-07 LAB — CBC
HEMATOCRIT: 29.2 % — AB (ref 36.0–46.0)
Hemoglobin: 9.7 g/dL — ABNORMAL LOW (ref 12.0–15.0)
MCH: 25.2 pg — ABNORMAL LOW (ref 26.0–34.0)
MCHC: 33.2 g/dL (ref 30.0–36.0)
MCV: 75.8 fL — ABNORMAL LOW (ref 78.0–100.0)
PLATELETS: 262 10*3/uL (ref 150–400)
RBC: 3.85 MIL/uL — AB (ref 3.87–5.11)
RDW: 15.7 % — ABNORMAL HIGH (ref 11.5–15.5)
WBC: 9.2 10*3/uL (ref 4.0–10.5)

## 2013-02-07 LAB — RPR

## 2013-02-07 LAB — GLUCOSE TOLERANCE, 2 HOURS W/ 1HR
Glucose, 1 hour: 119 mg/dL (ref 70–170)
Glucose, 2 hour: 96 mg/dL (ref 70–139)
Glucose, Fasting: 102 mg/dL — ABNORMAL HIGH (ref 70–99)

## 2013-02-07 LAB — HIV ANTIBODY (ROUTINE TESTING W REFLEX): HIV: NONREACTIVE

## 2013-02-09 ENCOUNTER — Encounter: Payer: Medicaid Other | Admitting: Obstetrics & Gynecology

## 2013-02-10 DIAGNOSIS — Z3480 Encounter for supervision of other normal pregnancy, unspecified trimester: Secondary | ICD-10-CM

## 2013-02-12 ENCOUNTER — Encounter: Payer: Self-pay | Admitting: Obstetrics & Gynecology

## 2013-02-12 DIAGNOSIS — O24419 Gestational diabetes mellitus in pregnancy, unspecified control: Secondary | ICD-10-CM | POA: Insufficient documentation

## 2013-02-14 ENCOUNTER — Encounter: Payer: Self-pay | Admitting: Obstetrics & Gynecology

## 2013-02-16 ENCOUNTER — Telehealth: Payer: Self-pay | Admitting: *Deleted

## 2013-02-16 NOTE — Telephone Encounter (Signed)
Patient called the office in response to hospital visit- returned patient call and patient was informed of lab results. Patient will try to come to office tomorrow for diabetic teaching and lab- but if she can not come- we will see her at her appointment Wednesday.

## 2013-02-17 ENCOUNTER — Other Ambulatory Visit: Payer: Self-pay | Admitting: *Deleted

## 2013-02-17 ENCOUNTER — Other Ambulatory Visit: Payer: Medicaid Other

## 2013-02-17 DIAGNOSIS — O24419 Gestational diabetes mellitus in pregnancy, unspecified control: Secondary | ICD-10-CM

## 2013-02-17 MED ORDER — ACCU-CHEK SOFT TOUCH LANCETS MISC: Status: DC

## 2013-02-17 MED ORDER — GLUCOSE BLOOD VI STRP: ORAL_STRIP | Status: DC

## 2013-02-17 MED ORDER — ACCU-CHEK AVIVA DEVI: Status: DC

## 2013-02-18 LAB — GC/CHLAMYDIA PROBE AMP
CT PROBE, AMP APTIMA: NEGATIVE
GC PROBE AMP APTIMA: NEGATIVE

## 2013-02-19 ENCOUNTER — Encounter (HOSPITAL_COMMUNITY): Payer: Self-pay | Admitting: *Deleted

## 2013-02-19 ENCOUNTER — Inpatient Hospital Stay (HOSPITAL_COMMUNITY)
Admission: AD | Admit: 2013-02-19 | Discharge: 2013-02-19 | Disposition: A | Discharge status: Home / Self Care | Payer: Medicaid Other | Source: Ambulatory Visit | Attending: Obstetrics & Gynecology | Admitting: Obstetrics & Gynecology

## 2013-02-19 DIAGNOSIS — O99810 Abnormal glucose complicating pregnancy: Secondary | ICD-10-CM | POA: Insufficient documentation

## 2013-02-19 DIAGNOSIS — N898 Other specified noninflammatory disorders of vagina: Secondary | ICD-10-CM | POA: Insufficient documentation

## 2013-02-19 DIAGNOSIS — O26899 Other specified pregnancy related conditions, unspecified trimester: Secondary | ICD-10-CM

## 2013-02-19 DIAGNOSIS — O9989 Other specified diseases and conditions complicating pregnancy, childbirth and the puerperium: Principal | ICD-10-CM

## 2013-02-19 DIAGNOSIS — O26879 Cervical shortening, unspecified trimester: Secondary | ICD-10-CM | POA: Insufficient documentation

## 2013-02-19 DIAGNOSIS — O4700 False labor before 37 completed weeks of gestation, unspecified trimester: Secondary | ICD-10-CM | POA: Insufficient documentation

## 2013-02-19 DIAGNOSIS — O99891 Other specified diseases and conditions complicating pregnancy: Secondary | ICD-10-CM | POA: Insufficient documentation

## 2013-02-19 NOTE — MAU Note (Signed)
Pt presents with complaints of leaking fluid this morning around 9. She states that she thinks she might had voided on herself but she wants to make sure it is not her membranes ruptured.

## 2013-02-19 NOTE — MAU Provider Note (Signed)
History     CSN: [id number]  Arrival date and time: 02/19/13 1120   First Provider Initiated Contact with Patient 02/19/13 1208      Chief Complaint  Patient presents with  . possible rupture of membranes    HPI 23 y.o. [id number] at [redacted]w[redacted]d with questionable leaking of fluid. States she thinks it may be her bladder leaking, but is not sure. Small gushes of fluid occasionally for the past 2 hours. Pregnancy complicated by preterm contractions, shortened cervix, GDM.   Past Medical History  Diagnosis Date  . H/O abdominal surgery   . Endometriosis   . Other and unspecified ovarian cysts   . UTI (lower urinary tract infection)   . Chlamydia     Past Surgical History  Procedure Laterality Date  . Tonsillectomy    . Abdominal exploration surgery  2013    to remove cysts & adhesions  . Adenoidectomy      Family History  Problem Relation Age of Onset  . Hypertension Mother   . Diabetes Mother   . Hypertension Father   . Diabetes Father     History  Substance Use Topics  . Smoking status: Never Smoker   . Smokeless tobacco: Never Used  . Alcohol Use: No    Allergies:  Allergies  Allergen Reactions  . Doxycycline Other (See Comments)    Causes abdominal muscle spasms  . Morphine And Related Nausea And Vomiting  . Latex Rash  . Percocet [Oxycodone-Acetaminophen] Rash    Prescriptions prior to admission  Medication Sig Dispense Refill  . acetaminophen (TYLENOL) 500 MG tablet Take 1,000 mg by mouth every 6 (six) hours as needed for headache.      . Blood Glucose Monitoring Suppl (ACCU-CHEK AVIVA) device Use as instructed  1 each  0  . glucose blood (ACCU-CHEK AVIVA) test strip Use as instructed  100 each  12  . Lancets (ACCU-CHEK SOFT TOUCH) lancets Use as instructed  100 each  12  . NIFEdipine (PROCARDIA) 10 MG capsule Take 1 capsule (10 mg total) by mouth every 6 (six) hours as needed.  30 capsule  0  . traMADol (ULTRAM) 50 MG tablet Take 1 tablet (50 mg total) by  mouth every 6 (six) hours as needed.  30 tablet  0    Review of Systems  Constitutional: Negative.   Respiratory: Negative.   Cardiovascular: Negative.   Gastrointestinal: Negative for nausea, vomiting, abdominal pain, diarrhea and constipation.  Genitourinary: Negative for dysuria, urgency, frequency, hematuria and flank pain.       Negative for vaginal bleeding, cramping/contractions  Musculoskeletal: Negative.   Neurological: Negative.   Psychiatric/Behavioral: Negative.    Physical Exam   Blood pressure 144/77, pulse 117, temperature 98.5 F (36.9 C), temperature source Oral, resp. rate 18, height 5\' 1"  (1.549 m), weight 170 lb (77.111 kg).  Physical Exam  Nursing note and vitals reviewed. Constitutional: She is oriented to person, place, and time. She appears well-developed and well-nourished. No distress.  HENT:  Head: Normocephalic and atraumatic.  Cardiovascular: Normal rate.   Respiratory: Effort normal.  GI: Soft. Bowel sounds are normal. She exhibits no mass. There is no tenderness. There is no rebound and no guarding.  Genitourinary: There is no rash or lesion on the right labia. There is no rash or lesion on the left labia. Uterus is not tender. Enlarged: Size c/w dates. Cervix exhibits no motion tenderness, no discharge and no friability. Right adnexum displays no mass, no tenderness and no fullness.  Left adnexum displays no mass, no tenderness and no fullness. No tenderness or bleeding around the vagina. Vaginal discharge (thick, white mucous, no pooling) found.  Cervix closed/50%/ballotable  Musculoskeletal: Normal range of motion.  Neurological: She is alert and oriented to person, place, and time.  Skin: Skin is warm and dry.  Psychiatric: She has a normal mood and affect.    MAU Course  Procedures  Fern Negative EFM reactive at 29 weeks, no UCs  Assessment and Plan   1. Vaginal discharge in pregnancy       Medication List         ACCU-CHEK AVIVA  device  Use as instructed     accu-chek soft touch lancets  Use as instructed     acetaminophen 500 MG tablet  Commonly known as:  TYLENOL  Take 1,000 mg by mouth every 6 (six) hours as needed for headache.     glucose blood test strip  Commonly known as:  ACCU-CHEK AVIVA  Use as instructed     NIFEdipine 10 MG capsule  Commonly known as:  PROCARDIA  Take 1 capsule (10 mg total) by mouth every 6 (six) hours as needed.     traMADol 50 MG tablet  Commonly known as:  ULTRAM  Take 1 tablet (50 mg total) by mouth every 6 (six) hours as needed.            Follow-up Information   [facility]   Specialty:  Obstetrics and Gynecology   Contact information:   [address], [address] [place] [id number] [phone]      Please follow up. (as scheduled or sooner as needed)         Shakeia Krus 02/19/2013, 12:09 PM

## 2013-02-20 LAB — URINE CULTURE: Colony Count: 25000

## 2013-02-22 ENCOUNTER — Inpatient Hospital Stay (HOSPITAL_COMMUNITY)
Admission: AD | Admit: 2013-02-22 | Discharge: 2013-02-25 | DRG: 778 | Disposition: A | Discharge status: Home / Self Care | Payer: Medicaid Other | Source: Ambulatory Visit | Attending: Obstetrics | Admitting: Obstetrics

## 2013-02-22 ENCOUNTER — Inpatient Hospital Stay (HOSPITAL_COMMUNITY): Payer: Medicaid Other

## 2013-02-22 ENCOUNTER — Encounter (HOSPITAL_COMMUNITY): Payer: Self-pay | Admitting: *Deleted

## 2013-02-22 ENCOUNTER — Other Ambulatory Visit: Payer: Medicaid Other

## 2013-02-22 ENCOUNTER — Encounter: Payer: Medicaid Other | Admitting: Obstetrics & Gynecology

## 2013-02-22 ENCOUNTER — Other Ambulatory Visit: Payer: Self-pay | Admitting: Obstetrics & Gynecology

## 2013-02-22 DIAGNOSIS — F410 Panic disorder [episodic paroxysmal anxiety] without agoraphobia: Secondary | ICD-10-CM | POA: Diagnosis present

## 2013-02-22 DIAGNOSIS — R42 Dizziness and giddiness: Secondary | ICD-10-CM | POA: Diagnosis present

## 2013-02-22 DIAGNOSIS — O368190 Decreased fetal movements, unspecified trimester, not applicable or unspecified: Secondary | ICD-10-CM | POA: Diagnosis present

## 2013-02-22 DIAGNOSIS — R259 Unspecified abnormal involuntary movements: Secondary | ICD-10-CM | POA: Diagnosis present

## 2013-02-22 DIAGNOSIS — E559 Vitamin D deficiency, unspecified: Secondary | ICD-10-CM

## 2013-02-22 DIAGNOSIS — O99810 Abnormal glucose complicating pregnancy: Secondary | ICD-10-CM | POA: Diagnosis present

## 2013-02-22 DIAGNOSIS — Z3480 Encounter for supervision of other normal pregnancy, unspecified trimester: Secondary | ICD-10-CM

## 2013-02-22 DIAGNOSIS — R109 Unspecified abdominal pain: Secondary | ICD-10-CM | POA: Diagnosis present

## 2013-02-22 DIAGNOSIS — O4700 False labor before 37 completed weeks of gestation, unspecified trimester: Principal | ICD-10-CM | POA: Diagnosis present

## 2013-02-22 DIAGNOSIS — W010XXA Fall on same level from slipping, tripping and stumbling without subsequent striking against object, initial encounter: Secondary | ICD-10-CM

## 2013-02-22 DIAGNOSIS — R5381 Other malaise: Secondary | ICD-10-CM | POA: Diagnosis present

## 2013-02-22 DIAGNOSIS — Y92009 Unspecified place in unspecified non-institutional (private) residence as the place of occurrence of the external cause: Secondary | ICD-10-CM

## 2013-02-22 DIAGNOSIS — W1809XA Striking against other object with subsequent fall, initial encounter: Secondary | ICD-10-CM | POA: Diagnosis present

## 2013-02-22 DIAGNOSIS — O365990 Maternal care for other known or suspected poor fetal growth, unspecified trimester, not applicable or unspecified: Secondary | ICD-10-CM

## 2013-02-22 DIAGNOSIS — O409XX0 Polyhydramnios, unspecified trimester, not applicable or unspecified: Secondary | ICD-10-CM

## 2013-02-22 DIAGNOSIS — Z3490 Encounter for supervision of normal pregnancy, unspecified, unspecified trimester: Secondary | ICD-10-CM

## 2013-02-22 DIAGNOSIS — O99340 Other mental disorders complicating pregnancy, unspecified trimester: Secondary | ICD-10-CM | POA: Diagnosis present

## 2013-02-22 DIAGNOSIS — R5383 Other fatigue: Secondary | ICD-10-CM

## 2013-02-22 DIAGNOSIS — O24419 Gestational diabetes mellitus in pregnancy, unspecified control: Secondary | ICD-10-CM

## 2013-02-22 DIAGNOSIS — O479 False labor, unspecified: Secondary | ICD-10-CM | POA: Diagnosis present

## 2013-02-22 HISTORY — DX: Gestational diabetes mellitus in pregnancy, unspecified control: O24.419

## 2013-02-22 LAB — CBC
HCT: 30.2 % — ABNORMAL LOW (ref 36.0–46.0)
HEMOGLOBIN: 9.7 g/dL — AB (ref 12.0–15.0)
MCH: 24.1 pg — AB (ref 26.0–34.0)
MCHC: 32.1 g/dL (ref 30.0–36.0)
MCV: 74.9 fL — AB (ref 78.0–100.0)
Platelets: 223 10*3/uL (ref 150–400)
RBC: 4.03 MIL/uL (ref 3.87–5.11)
RDW: 15.2 % (ref 11.5–15.5)
WBC: 10.8 10*3/uL — ABNORMAL HIGH (ref 4.0–10.5)

## 2013-02-22 LAB — COMPREHENSIVE METABOLIC PANEL
ALBUMIN: 2.5 g/dL — AB (ref 3.5–5.2)
ALK PHOS: 128 U/L — AB (ref 39–117)
ALT: <5 U/L (ref 0–35)
AST: 9 U/L (ref 0–37)
BILIRUBIN TOTAL: 0.3 mg/dL (ref 0.3–1.2)
BUN: 6 mg/dL (ref 6–23)
CO2: 22 meq/L (ref 19–32)
CREATININE: 0.51 mg/dL (ref 0.50–1.10)
Calcium: 8.7 mg/dL (ref 8.4–10.5)
Chloride: 102 mEq/L (ref 96–112)
GFR calc Af Amer: >90 mL/min (ref >90)
GLUCOSE: 80 mg/dL (ref 70–99)
POTASSIUM: 4.1 meq/L (ref 3.7–5.3)
Sodium: 137 mEq/L (ref 137–147)
Total Protein: 6.9 g/dL (ref 6.0–8.3)

## 2013-02-22 LAB — FIBRINOGEN: FIBRINOGEN: 509 mg/dL — AB (ref 204–475)

## 2013-02-22 LAB — TYPE AND SCREEN
ABO/RH(D): B POS
Antibody Screen: NEGATIVE

## 2013-02-22 LAB — GLUCOSE, CAPILLARY
GLUCOSE-CAPILLARY: 72 mg/dL (ref 70–99)
Glucose-Capillary: 117 mg/dL — ABNORMAL HIGH (ref 70–99)

## 2013-02-22 MED ORDER — ZOLPIDEM TARTRATE 5 MG PO TABS
5.0000 mg | ORAL_TABLET | Freq: Every evening | ORAL | Status: DC | PRN
  Administered 2013-02-22 – 2013-02-24 (×3): 5 mg via ORAL
  Filled 2013-02-22 (×3): qty 1

## 2013-02-22 MED ORDER — LACTATED RINGERS IV BOLUS (SEPSIS)
500.0000 mL | Freq: Once | INTRAVENOUS | Status: AC
  Administered 2013-02-22: 500 mL via INTRAVENOUS

## 2013-02-22 MED ORDER — ACETAMINOPHEN 325 MG PO TABS
650.0000 mg | ORAL_TABLET | Freq: Once | ORAL | Status: AC
  Administered 2013-02-22: 650 mg via ORAL
  Filled 2013-02-22: qty 2

## 2013-02-22 MED ORDER — ACETAMINOPHEN 325 MG PO TABS
650.0000 mg | ORAL_TABLET | ORAL | Status: DC | PRN
  Administered 2013-02-22 – 2013-02-25 (×5): 650 mg via ORAL
  Filled 2013-02-22 (×5): qty 2

## 2013-02-22 MED ORDER — NIFEDIPINE 10 MG PO CAPS
20.0000 mg | ORAL_CAPSULE | Freq: Four times a day (QID) | ORAL | Status: DC | PRN
  Administered 2013-02-22 – 2013-02-23 (×4): 20 mg via ORAL
  Filled 2013-02-22 (×5): qty 2

## 2013-02-22 MED ORDER — PRENATAL MULTIVITAMIN CH
1.0000 | ORAL_TABLET | Freq: Every day | ORAL | Status: DC
  Administered 2013-02-23 – 2013-02-25 (×3): 1 via ORAL
  Filled 2013-02-22 (×3): qty 1

## 2013-02-22 MED ORDER — CALCIUM CARBONATE ANTACID 500 MG PO CHEW: 2.0000 | CHEWABLE_TABLET | ORAL | Status: DC | PRN

## 2013-02-22 MED ORDER — DOCUSATE SODIUM 100 MG PO CAPS
100.0000 mg | ORAL_CAPSULE | Freq: Every day | ORAL | Status: DC
Start: 2013-02-22 — End: 2013-02-25
  Administered 2013-02-23 – 2013-02-25 (×3): 100 mg via ORAL
  Filled 2013-02-22 (×3): qty 1

## 2013-02-22 NOTE — MAU Provider Note (Signed)
History     CSN: [id number]  Arrival date and time: 02/22/13 1049   First Provider Initiated Contact with Patient 02/22/13 1135      Chief Complaint  Patient presents with  . Panic Attack  . Fall   HPI [name] is a 23 y.o. [phone] at 110w1d who presents to MAU today with complaint of a fall at home. The patient states that she had a panic attack and fell onto her bed and then onto the ground on her abdomen. She also hit her head. She states decreased FM since the fall. She denies vaginal bleeding, discharge or LOF. She is having tightening of her abdomen and feels "bruised." She feels that these attacks are stress related and has had them in the past. She is also complaining of twitching on the right side of her face since arrival in MAU. She continues to feel dizziness and weakness.   OB History   Grav Para Term Preterm Abortions TAB SAB Ect Mult Living   4 1 1  2 1 1   1       Past Medical History  Diagnosis Date  . H/O abdominal surgery   . Endometriosis   . Other and unspecified ovarian cysts   . UTI (lower urinary tract infection)   . Chlamydia   . Gestational diabetes     Past Surgical History  Procedure Laterality Date  . Tonsillectomy    . Abdominal exploration surgery  2013    to remove cysts & adhesions  . Adenoidectomy      Family History  Problem Relation Age of Onset  . Hypertension Mother   . Diabetes Mother   . Hypertension Father   . Diabetes Father     History  Substance Use Topics  . Smoking status: Never Smoker   . Smokeless tobacco: Never Used  . Alcohol Use: No    Allergies:  Allergies  Allergen Reactions  . Doxycycline Other (See Comments)    Causes abdominal muscle spasms  . Latex Rash  . Percocet [Oxycodone-Acetaminophen] Rash    Prescriptions prior to admission  Medication Sig Dispense Refill  . acetaminophen (TYLENOL) 500 MG tablet Take 1,000 mg by mouth every 6 (six) hours as needed for headache.      Marland Kitchen  NIFEdipine (PROCARDIA) 10 MG capsule Take 1 capsule (10 mg total) by mouth every 6 (six) hours as needed.  30 capsule  0    Review of Systems  Constitutional: Negative for fever and malaise/fatigue.  Gastrointestinal: Positive for nausea, abdominal pain and diarrhea. Negative for vomiting and constipation.  Genitourinary: Negative for dysuria, urgency and frequency.       Neg - vaginal bleeding, discharge, LOF  Neurological: Positive for loss of consciousness. Negative for dizziness.   Physical Exam   Blood pressure 122/78, pulse 109, temperature 98.5 F (36.9 C), temperature source Oral, resp. rate 18, height 5\' 1"  (1.549 m), weight 174 lb (78.926 kg), SpO2 100.00%.  Physical Exam  Constitutional: She is oriented to person, place, and time. She appears well-developed and well-nourished. No distress.  HENT:  Head: Normocephalic and atraumatic.  Cardiovascular: Regular rhythm and normal heart sounds.  Tachycardia present.   Respiratory: Effort normal and breath sounds normal. No respiratory distress.  GI: Soft. She exhibits no distension and no mass. There is no tenderness. There is no rebound and no guarding.  Neurological: She is alert and oriented to person, place, and time.  Skin: Skin is warm and dry. No erythema.  Psychiatric: She has a normal mood and affect.   Results for orders placed during the hospital encounter of 02/22/13 (from the past 24 hour(s))  GLUCOSE, CAPILLARY     Status: None   Collection Time    02/22/13 12:13 PM      Result Value Ref Range   Glucose-Capillary 72  70 - 99 mg/dL  CBC     Status: Abnormal   Collection Time    02/22/13 12:29 PM      Result Value Ref Range   WBC 10.8 (*) 4.0 - 10.5 K/uL   RBC 4.03  3.87 - 5.11 MIL/uL   Hemoglobin 9.7 (*) 12.0 - 15.0 g/dL   HCT 16.130.2 (*) 09.636.0 - 04.546.0 %   MCV 74.9 (*) 78.0 - 100.0 fL   MCH 24.1 (*) 26.0 - 34.0 pg   MCHC 32.1  30.0 - 36.0 g/dL   RDW 40.915.2  81.111.5 - 91.415.5 %   Platelets 223  150 - 400 K/uL   COMPREHENSIVE METABOLIC PANEL     Status: Abnormal   Collection Time    02/22/13 12:29 PM      Result Value Ref Range   Sodium 137  137 - 147 mEq/L   Potassium 4.1  3.7 - 5.3 mEq/L   Chloride 102  96 - 112 mEq/L   CO2 22  19 - 32 mEq/L   Glucose, Bld 80  70 - 99 mg/dL   BUN 6  6 - 23 mg/dL   Creatinine, Ser 7.820.51  0.50 - 1.10 mg/dL   Calcium 8.7  8.4 - 95.610.5 mg/dL   Total Protein 6.9  6.0 - 8.3 g/dL   Albumin 2.5 (*) 3.5 - 5.2 g/dL   AST 9  0 - 37 U/L   ALT <5  0 - 35 U/L   Alkaline Phosphatase 128 (*) 39 - 117 U/L   Total Bilirubin 0.3  0.3 - 1.2 mg/dL   GFR calc non Af Amer >90  >90 mL/min   GFR calc Af Amer >90  >90 mL/min    Fetal Monitoring: Baseline: 140 bpm, moderate variability, + accelerations, no decelerations Contractions: moderate UI with irregular contractions progressing to ctx q 2 minutes  MAU Course  Procedures None  MDM Discussed with Dr. Tamela OddiJackson-Moore. Order CBG, check cervix, NST x 2 hours.  Lab results and patient status discussed with Dr. Tamela OddiJackson-Moore including frequent palpable contractions. Recommends continued NST x 2 additional hours and recheck cervix.  650 mg Tylenol given Patient continues to contract q 2-3 minutes. Rates back pain at 7/10 with no relief from Tylenol. Cervix is unchanged. Report to Dr.Jackson-Moore. Patient to be admitted to antenatal for observation.  Assessment and Plan  A: Preterm contractions following abdominal trauma from fall at home  P: Admit to Antenatal for observation Verbal orders from Dr. Tamela OddiJackson-Moore include saline lock, regular diet, Fibrinogen lab, continuous toco with 30 minutes NST q shift, follow-up US, bed rest with bathroom privileges in addition to standard antenatal admission order set  Freddi StarrJulie N Ethier, PA-C  02/22/2013, 3:50 PM

## 2013-02-22 NOTE — Plan of Care (Signed)
Problem: Consults Goal: Birthing Suites Patient Information Press F2 to bring up selections list   Pt < [redacted] weeks EGA     

## 2013-02-22 NOTE — MAU Note (Signed)
Pt states she has been having some panic attacks since last night.  Last night she had two and went to sleep.  Pt states she is very stressed but does not elaborate.  Pt states she had a panic attack at 0800 this morning and black out.  She tried to catch herself from falling but fell on her abd.  Pt states the baby wasn't moving until she arrive to MAU approx. 35-40 minutes.  Denies vaginal bleeding.  Pt states the baby has started to move now.

## 2013-02-22 NOTE — H&P (Signed)
History   CSN: [id number]  Arrival date and time: 02/22/13 1049  First Provider Initiated Contact with Patient 02/22/13 1135  Chief Complaint   Patient presents with   .  Panic Attack   .  Fall   HPI  [name] is a 23 y.o. [phone] at 8331w1d who presents to MAU today with complaint of a fall at home. The patient states that she had a panic attack and fell onto her bed and then onto the ground on her abdomen. She also hit her head. She states decreased FM since the fall. She denies vaginal bleeding, discharge or LOF. She is having tightening of her abdomen and feels "bruised." She feels that these attacks are stress related and has had them in the past. She is also complaining of twitching on the right side of her face since arrival in MAU. She continues to feel dizziness and weakness.  OB History    Grav  Para  Term  Preterm  Abortions  TAB  SAB  Ect  Mult  Living    4  1  1   2  1  1    1       Past Medical History   Diagnosis  Date   .  H/O abdominal surgery    .  Endometriosis    .  Other and unspecified ovarian cysts    .  UTI (lower urinary tract infection)    .  Chlamydia    .  Gestational diabetes     Past Surgical History   Procedure  Laterality  Date   .  Tonsillectomy     .  Abdominal exploration surgery   2013     to remove cysts & adhesions   .  Adenoidectomy      Family History   Problem  Relation  Age of Onset   .  Hypertension  Mother    .  Diabetes  Mother    .  Hypertension  Father    .  Diabetes  Father     History   Substance Use Topics   .  Smoking status:  Never Smoker   .  Smokeless tobacco:  Never Used   .  Alcohol Use:  No   Allergies:  Allergies   Allergen  Reactions   .  Doxycycline  Other (See Comments)     Causes abdominal muscle spasms   .  Latex  Rash   .  Percocet [Oxycodone-Acetaminophen]  Rash    Prescriptions prior to admission   Medication  Sig  Dispense  Refill   .  acetaminophen (TYLENOL) 500 MG tablet  Take 1,000 mg by  mouth every 6 (six) hours as needed for headache.     Marland Kitchen.  NIFEdipine (PROCARDIA) 10 MG capsule  Take 1 capsule (10 mg total) by mouth every 6 (six) hours as needed.  30 capsule  0   Review of Systems  Constitutional: Negative for fever and malaise/fatigue.  Gastrointestinal: Positive for nausea, abdominal pain and diarrhea. Negative for vomiting and constipation.  Genitourinary: Negative for dysuria, urgency and frequency.  Neg - vaginal bleeding, discharge, LOF  Neurological: Positive for loss of consciousness. Negative for dizziness.  Physical Exam   Blood pressure 122/78, pulse 109, temperature 98.5 F (36.9 C), temperature source Oral, resp. rate 18, height 5\' 1"  (1.549 m), weight 174 lb (78.926 kg), SpO2 100.00%.  Physical Exam  Constitutional: She is oriented to person, place, and time. She appears well-developed  and well-nourished. No distress.  HENT:  Head: Normocephalic and atraumatic.  Cardiovascular: Regular rhythm and normal heart sounds. Tachycardia present.  Respiratory: Effort normal and breath sounds normal. No respiratory distress.  GI: Soft. She exhibits no distension and no mass. There is no tenderness. There is no rebound and no guarding.  Neurological: She is alert and oriented to person, place, and time.  Skin: Skin is warm and dry. No erythema.  Psychiatric: She has a normal mood and affect.  Results for orders placed during the hospital encounter of 02/22/13 (from the past 24 hour(s))   GLUCOSE, CAPILLARY Status: None    Collection Time    02/22/13 12:13 PM   Result  Value  Ref Range    Glucose-Capillary  72  70 - 99 mg/dL   CBC Status: Abnormal    Collection Time    02/22/13 12:29 PM   Result  Value  Ref Range    WBC  10.8 (*)  4.0 - 10.5 K/uL    RBC  4.03  3.87 - 5.11 MIL/uL    Hemoglobin  9.7 (*)  12.0 - 15.0 g/dL    HCT  16.1 (*)  09.6 - 46.0 %    MCV  74.9 (*)  78.0 - 100.0 fL    MCH  24.1 (*)  26.0 - 34.0 pg    MCHC  32.1  30.0 - 36.0 g/dL    RDW   04.5  40.9 - 81.1 %    Platelets  223  150 - 400 K/uL   COMPREHENSIVE METABOLIC PANEL Status: Abnormal    Collection Time    02/22/13 12:29 PM   Result  Value  Ref Range    Sodium  137  137 - 147 mEq/L    Potassium  4.1  3.7 - 5.3 mEq/L    Chloride  102  96 - 112 mEq/L    CO2  22  19 - 32 mEq/L    Glucose, Bld  80  70 - 99 mg/dL    BUN  6  6 - 23 mg/dL    Creatinine, Ser  9.14  0.50 - 1.10 mg/dL    Calcium  8.7  8.4 - 10.5 mg/dL    Total Protein  6.9  6.0 - 8.3 g/dL    Albumin  2.5 (*)  3.5 - 5.2 g/dL    AST  9  0 - 37 U/L    ALT  <5  0 - 35 U/L    Alkaline Phosphatase  128 (*)  39 - 117 U/L    Total Bilirubin  0.3  0.3 - 1.2 mg/dL    GFR calc non Af Amer  >90  >90 mL/min    GFR calc Af Amer  >90  >90 mL/min   Fetal Monitoring:  Baseline: 140 bpm, moderate variability, + accelerations, no decelerations  Contractions: moderate UI with irregular contractions progressing to ctx q 2 minutes   Assessment and Plan   A:  Preterm contractions following abdominal trauma from fall at home  GDM P:  Admit to Antenatal for observation  Saline lock, carb modified diet, Fibrinogen lab, continuous toco with 30 minutes NST q shift, follow-up US, bed rest with bathroom privileges in addition to standard antenatal admission order set, check CBGs

## 2013-02-23 DIAGNOSIS — Z3490 Encounter for supervision of normal pregnancy, unspecified, unspecified trimester: Secondary | ICD-10-CM

## 2013-02-23 LAB — URINALYSIS, ROUTINE W REFLEX MICROSCOPIC
BILIRUBIN URINE: NEGATIVE
Glucose, UA: NEGATIVE mg/dL
Ketones, ur: NEGATIVE mg/dL
NITRITE: NEGATIVE
PROTEIN: NEGATIVE mg/dL
SPECIFIC GRAVITY, URINE: 1.015 (ref 1.005–1.030)
Urobilinogen, UA: 0.2 mg/dL (ref 0.0–1.0)
pH: 7.5 (ref 5.0–8.0)

## 2013-02-23 LAB — URINE MICROSCOPIC-ADD ON

## 2013-02-23 LAB — GLUCOSE, CAPILLARY
GLUCOSE-CAPILLARY: 98 mg/dL (ref 70–99)
Glucose-Capillary: 97 mg/dL (ref 70–99)
Glucose-Capillary: 106 mg/dL — ABNORMAL HIGH (ref 70–99)
Glucose-Capillary: 120 mg/dL — ABNORMAL HIGH (ref 70–99)

## 2013-02-23 MED ORDER — SODIUM CHLORIDE 0.9 % IJ SOLN
3.0000 mL | Freq: Two times a day (BID) | INTRAMUSCULAR | Status: DC
  Administered 2013-02-23 – 2013-02-25 (×5): 3 mL via INTRAVENOUS

## 2013-02-23 MED ORDER — MECLIZINE HCL 12.5 MG PO TABS
12.5000 mg | ORAL_TABLET | Freq: Three times a day (TID) | ORAL | Status: DC
  Administered 2013-02-23 – 2013-02-25 (×7): 12.5 mg via ORAL
  Filled 2013-02-23 (×7): qty 1

## 2013-02-23 MED ORDER — DEXTROSE 5 % IV SOLN
1.0000 g | Freq: Two times a day (BID) | INTRAVENOUS | Status: DC
  Administered 2013-02-23 – 2013-02-25 (×5): 1 g via INTRAVENOUS
  Filled 2013-02-23 (×5): qty 10

## 2013-02-23 NOTE — Progress Notes (Signed)
This note also relates to the following rows which could not be included: CBG Lab Component - View only - Cannot attach notes to extension rows   Reactive NST

## 2013-02-23 NOTE — Progress Notes (Signed)
UR completed 

## 2013-02-23 NOTE — Progress Notes (Signed)
Reactive NST 

## 2013-02-23 NOTE — Progress Notes (Signed)
Patient ID: Sara Park, female   DOB: 07/08/1990, 23 y.o.   MRN: [id number] Hospital Day: 2  S: Preterm labor symptoms: Tightening and cramping.  Less.  O: Blood pressure 110/57, pulse 101, temperature 98.6 F (37 C), temperature source Oral, resp. rate 18, height 5\' 1"  (1.549 m), weight 174 lb (78.926 kg), SpO2 100.00%.   WUJ:WJXBJYNWFHT:Baseline: 140-150 bpm bpm Toco: Intensity: mild, occasional UC's GNF:AOZHYQMVSVE:Dilation: 1 Effacement (%): Thick Cervical Position: Middle Exam by:: L. Paschal, RN   A/P- [age] y.o. admitted with:  Cramping, pain and dizziness associated with fall.  Stable, but still has some dizziness.  Meclizine Rx.  Continue observation.   Present on Admission:  . Preterm contractions  Pregnancy Complications: gestational diabetes  Preterm labor management: IV D5LR started and Procardia Dating:  5261w2d PNL Needed:  None FWB:  Good PTL:  Stable

## 2013-02-24 LAB — GLUCOSE, CAPILLARY
GLUCOSE-CAPILLARY: 86 mg/dL (ref 70–99)
GLUCOSE-CAPILLARY: 101 mg/dL — AB (ref 70–99)
GLUCOSE-CAPILLARY: 102 mg/dL — AB (ref 70–99)
Glucose-Capillary: 84 mg/dL (ref 70–99)

## 2013-02-24 LAB — WET PREP, GENITAL
Clue Cells Wet Prep HPF POC: NONE SEEN
Trich, Wet Prep: NONE SEEN
Yeast Wet Prep HPF POC: NONE SEEN

## 2013-02-24 LAB — URINE CULTURE

## 2013-02-24 LAB — FETAL FIBRONECTIN: Fetal Fibronectin: INVALID — AB

## 2013-02-24 MED ORDER — RANITIDINE HCL 150 MG/10ML PO SYRP
150.0000 mg | ORAL_SOLUTION | Freq: Two times a day (BID) | ORAL | Status: DC
Start: 2013-02-24 — End: 2013-02-24

## 2013-02-24 MED ORDER — SODIUM CHLORIDE 0.9 % IV BOLUS (SEPSIS)
1000.0000 mL | Freq: Once | INTRAVENOUS | Status: AC
  Administered 2013-02-24: 1000 mL via INTRAVENOUS

## 2013-02-24 NOTE — Progress Notes (Signed)
Dr Clearance CootsHarper notified of invalid results for FFN.

## 2013-02-24 NOTE — Progress Notes (Signed)
Teleconference finished

## 2013-02-24 NOTE — Progress Notes (Signed)
Antenatal Nutrition Assessment:  Currently  30 3/[redacted] weeks gestation, with contractions after a fall. Height  61 "  Weight 174 lbs  pre-pregnancy weight 119 lbs .  Pre-pregnancy  BMI 22.5  IBW 105 lbs Total weight gain 55.lbs Weight gain goals 25-35 lbs Estimated needs: 18-2000 kcal/day, 68-78 grams protein/day, 2.1 liters fluid/day  CHO modified Gestational diet tolerated well, appetite good. Current diet prescription will provide for increased needs.  No abnormal nutrition related labs  CBG (last 3)   Recent Labs  02/23/13 2346 02/24/13 0645 02/24/13 1035  GLUCAP 98 86 84      Nutrition Dx: Increased nutrient needs r/t pregnancy and fetal growth requirements aeb [redacted] weeks gestation.  No educational needs assessed at this time. Pt [facility]. Has no questions  Elisabeth CaraKatherine Malekai Markwood M.Odis LusterEd. R.D. LDN Neonatal Nutrition Support Specialist Pager [phone]

## 2013-02-24 NOTE — Progress Notes (Signed)
pysch teleassessment in progress in patients room

## 2013-02-24 NOTE — Progress Notes (Signed)
Patient ID: Sara Park, female   DOB: 03/15/1990, 23 y.o.   MRN: [id number] Hospital Day: 3  S: Preterm labor symptoms: Tightening and cramping, reports contractions cont every 8-10 min. States she continues to have pain in her vagina, my impression is it is her cervix. States she could reach up and touch the area if her fingers were long enough. Concerned of vaginal infection and preterm labor. Reports ongoing anxiety and stress over other matters that she does not discuss in more detail.  O: Blood pressure 114/55, pulse 107, temperature 98.8 F (37.1 C), temperature source Oral, resp. rate 18, height 5\' 1"  (1.549 m), weight 174 lb (78.926 kg), SpO2 100.00%.   WUJ:WJXBJYNWFHT:Baseline: 140-150 bpm bpm Toco: Intensity: mild, occasional UC's GNF:AOZHYQMVSVE:Dilation: 1 Effacement (%): Thick Cervical Position: Middle Exam by:: L. Paschal, RN   A/P- 23 y.o. admitted with:  Cramping, pain and dizziness associated with fall.  Stable, but still has some dizziness.  Meclizine Rx.  Continue observation.   Present on Admission:  . Preterm contractions  Will wait until 48 hours after her last exam. Do a vaginal exam w/ ffn. Following perform a digital exam. Will rule out vaginitis. Urine culture inconclusive. Psych consult for depression and anxiety.  Pregnancy Complications: gestational diabetes  Preterm labor management: IV D5LR started and Procardia Dating:  3245w2d PNL Needed:  None FWB:  Good PTL:  Stable
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
## 2013-02-25 LAB — GLUCOSE, CAPILLARY
GLUCOSE-CAPILLARY: 90 mg/dL (ref 70–99)
GLUCOSE-CAPILLARY: 78 mg/dL (ref 70–99)

## 2013-02-25 LAB — TYPE AND SCREEN
ABO/RH(D): B POS
ANTIBODY SCREEN: NEGATIVE

## 2013-02-25 MED ORDER — CEFUROXIME AXETIL 500 MG PO TABS: 500.0000 mg | ORAL_TABLET | Freq: Two times a day (BID) | ORAL | Status: DC

## 2013-02-25 MED ORDER — MECLIZINE HCL 12.5 MG PO TABS: 12.5000 mg | ORAL_TABLET | Freq: Three times a day (TID) | ORAL | Status: DC

## 2013-02-25 NOTE — Discharge Summary (Signed)
Physician Discharge Summary  Patient ID: Sara Park MRN: [id number] DOB/AGE: 24/02/1990 24 y.o.  Admit date: 02/22/2013 Discharge date: 02/25/2013  Admission Diagnoses:  30 weeks.  Threatened PTL.  Discharge Diagnoses:  Same Principal Problem:   Preterm contractions Active Problems:   Pregnant   Discharged Condition: good  Hospital Course: Admitted with cramping and dizziness.  Improved with IVF's and rest.  Discharged home in good condition.  Consults: psychiatry  Significant Diagnostic Studies: labs: CBC, CMET  Treatments: IV hydration, antibiotics: ceftriaxone and Meclizine  Discharge Exam: Blood pressure 103/55, pulse 101, temperature 98.5 F (36.9 C), temperature source Oral, resp. rate 20, height 5\' 1"  (1.549 m), weight 174 lb (78.926 kg), SpO2 100.00%. General appearance: alert and no distress GI: normal findings: soft, non-tender  Disposition: 01-Home or Self Care     Medication List         acetaminophen 500 MG tablet  Commonly known as:  TYLENOL  Take 1,000 mg by mouth every 6 (six) hours as needed for headache.     cefUROXime 500 MG tablet  Commonly known as:  CEFTIN  Take 1 tablet (500 mg total) by mouth 2 (two) times daily with a meal.     meclizine 12.5 MG tablet  Commonly known as:  ANTIVERT  Take 1 tablet (12.5 mg total) by mouth 3 (three) times daily.     NIFEdipine 10 MG capsule  Commonly known as:  PROCARDIA  Take 1 capsule (10 mg total) by mouth every 6 (six) hours as needed.           Follow-up Information   Follow up with Antionette CharJACKSON-MOORE,LISA A, MD. Schedule an appointment as soon as possible for a visit in 1 week.   Specialty:  Obstetrics and Gynecology   Contact information:   [address] [place] [address] [phone]       Signed: Brock BadHARPER,CHARLES A 02/25/2013, 7:22 AM

## 2013-02-25 NOTE — Progress Notes (Signed)
Pt teaching completed with a teachback done by patient. Patient is aware of follow up appointment, medication and s/s to return for and report.

## 2013-02-25 NOTE — Progress Notes (Signed)
Patient ID: Sara Park, female   DOB: 07/09/1990, 23 y.o.   MRN: [id number] Hospital Day: 4  S: Preterm labor symptoms: Cramping and dizziness.  O: Blood pressure 103/55, pulse 101, temperature 98.5 F (36.9 C), temperature source Oral, resp. rate 20, height 5\' 1"  (1.549 m), weight 174 lb (78.926 kg), SpO2 100.00%.   WUJ:WJXBJYNWFHT:Baseline: 140-150 bpm Toco: Occasional UC's, mild GNF:AOZHYQMVSVE:Dilation: 2 Effacement (%): 60 Cervical Position: Middle Station: Ballotable Exam by:: a. wren cnm  A/P- 23 y.o. admitted with:  Cramping and dizziness.  Improved.  Discharge home.  Present on Admission:  . Preterm contractions  Pregnancy Complications: None  Preterm labor management: no treatment necessary Dating:  5676w4d PNL Needed:  None FWB:  Good PTL:  None

## 2013-03-03 ENCOUNTER — Ambulatory Visit (INDEPENDENT_AMBULATORY_CARE_PROVIDER_SITE_OTHER): Payer: Medicaid Other | Admitting: Obstetrics & Gynecology

## 2013-03-03 VITALS — BP 108/69 | Temp 98.6°F | Wt 171.0 lb

## 2013-03-03 DIAGNOSIS — Z3480 Encounter for supervision of other normal pregnancy, unspecified trimester: Secondary | ICD-10-CM

## 2013-03-03 LAB — POCT URINALYSIS DIPSTICK
Bilirubin, UA: NEGATIVE
Blood, UA: NEGATIVE
Glucose, UA: NEGATIVE
NITRITE UA: NEGATIVE
PH UA: 7
Protein, UA: NEGATIVE
Spec Grav, UA: 1.015
Urobilinogen, UA: NEGATIVE

## 2013-03-03 NOTE — Progress Notes (Signed)
Pulse: 98 Patient state she was spotting 2 days ago, but that it was only when she wiped. Patient states she is having a stabbing pain in her vagina. Patient states she is having lower abdominal pressure. Patient states she was having contractions 2 days ago every 5 minutes not lasting longer then 20-30 seconds for the whole day but when she went to sleep and woke up they were irregular again. Patient states after she uses the restroom she always has a really bad burning sensation. Patient states when she was in the hospital last week they gave her a bunch of antibiotic so she doesn't figure it is a infection.

## 2013-03-07 ENCOUNTER — Inpatient Hospital Stay (HOSPITAL_COMMUNITY)
Admission: AD | Admit: 2013-03-07 | Discharge: 2013-03-07 | Disposition: A | Discharge status: Home / Self Care | Payer: Medicaid Other | Source: Ambulatory Visit | Attending: Obstetrics | Admitting: Obstetrics

## 2013-03-07 ENCOUNTER — Encounter (HOSPITAL_COMMUNITY): Payer: Self-pay | Admitting: *Deleted

## 2013-03-07 DIAGNOSIS — O09899 Supervision of other high risk pregnancies, unspecified trimester: Secondary | ICD-10-CM

## 2013-03-07 DIAGNOSIS — O4700 False labor before 37 completed weeks of gestation, unspecified trimester: Secondary | ICD-10-CM | POA: Insufficient documentation

## 2013-03-07 DIAGNOSIS — M545 Low back pain, unspecified: Secondary | ICD-10-CM | POA: Insufficient documentation

## 2013-03-07 DIAGNOSIS — R8789 Other abnormal findings in specimens from female genital organs: Secondary | ICD-10-CM

## 2013-03-07 LAB — FETAL FIBRONECTIN: Fetal Fibronectin: POSITIVE — AB

## 2013-03-07 MED ORDER — NIFEDIPINE 10 MG PO CAPS
10.0000 mg | ORAL_CAPSULE | Freq: Once | ORAL | Status: AC
  Administered 2013-03-07: 10 mg via ORAL
  Filled 2013-03-07: qty 1

## 2013-03-07 MED ORDER — BETAMETHASONE SOD PHOS & ACET 6 (3-3) MG/ML IJ SUSP
12.0000 mg | Freq: Once | INTRAMUSCULAR | Status: AC
  Administered 2013-03-07: 12 mg via INTRAMUSCULAR
  Filled 2013-03-07: qty 2

## 2013-03-07 NOTE — Discharge Instructions (Signed)
Pelvic Rest Pelvic rest is sometimes recommended for women when:   The placenta is partially or completely covering the opening of the cervix (placenta previa).  There is bleeding between the uterine wall and the amniotic sac in the first trimester (subchorionic hemorrhage).  The cervix begins to open without labor starting (incompetent cervix, cervical insufficiency).  The labor is too early (preterm labor). HOME CARE INSTRUCTIONS  Do not have sexual intercourse, stimulation, or an orgasm.  Do not use tampons, douche, or put anything in the vagina.  Do not lift anything over 10 pounds (4.5 kg).  Avoid strenuous activity or straining your pelvic muscles. SEEK MEDICAL CARE IF:  You have any vaginal bleeding during pregnancy. Treat this as a potential emergency.  You have cramping pain felt low in the stomach (stronger than menstrual cramps).  You notice vaginal discharge (watery, mucus, or bloody).  You have a low, dull backache.  There are regular contractions or uterine tightening. SEEK IMMEDIATE MEDICAL CARE IF: You have vaginal bleeding and have placenta previa.  Document Released: 04/18/2010 Document Revised: 03/16/2011 Document Reviewed: 04/18/2010 [facility] Patient Information 2014 [employer], [place].  Preterm Labor Information Preterm labor is when labor starts at less than 37 weeks of pregnancy. The normal length of a pregnancy is 39 to 41 weeks. CAUSES Often, there is no identifiable underlying cause as to why a woman goes into preterm labor. One of the most common known causes of preterm labor is infection. Infections of the uterus, cervix, vagina, amniotic sac, bladder, kidney, or even the lungs (pneumonia) can cause labor to start. Other suspected causes of preterm labor include:   Urogenital infections, such as yeast infections and bacterial vaginosis.   Uterine abnormalities (uterine shape, uterine septum, fibroids, or bleeding from the placenta).   A cervix  that has been operated on (it may fail to stay closed).   Malformations in the fetus.   Multiple gestations (twins, triplets, and so on).   Breakage of the amniotic sac.  RISK FACTORS  Having a previous history of preterm labor.   Having premature rupture of membranes (PROM).   Having a placenta that covers the opening of the cervix (placenta previa).   Having a placenta that separates from the uterus (placental abruption).   Having a cervix that is too weak to hold the fetus in the uterus (incompetent cervix).   Having too much fluid in the amniotic sac (polyhydramnios).   Taking illegal drugs or smoking while pregnant.   Not gaining enough weight while pregnant.   Being younger than 3818 and older than 24 years old.   Having a low socioeconomic status.   Being African American. SYMPTOMS Signs and symptoms of preterm labor include:   Menstrual-like cramps, abdominal pain, or back pain.  Uterine contractions that are regular, as frequent as six in an hour, regardless of their intensity (may be mild or painful).  Contractions that start on the top of the uterus and spread down to the lower abdomen and back.   A sense of increased pelvic pressure.   A watery or bloody mucus discharge that comes from the vagina.  TREATMENT Depending on the length of the pregnancy and other circumstances, your health care provider may suggest bed rest. If necessary, there are medicines that can be given to stop contractions and to mature the fetal lungs. If labor happens before 34 weeks of pregnancy, a prolonged hospital stay may be recommended. Treatment depends on the condition of both you and the fetus.  WHAT SHOULD YOU DO IF YOU THINK YOU ARE IN PRETERM LABOR? Call your health care provider right away. You will need to go to the hospital to get checked immediately. HOW CAN YOU PREVENT PRETERM LABOR IN FUTURE PREGNANCIES? You should:   Stop smoking if you  smoke.  Maintain healthy weight gain and avoid chemicals and drugs that are not necessary.  Be watchful for any type of infection.  Inform your health care provider if you have a known history of preterm labor. Document Released: 03/14/2003 Document Revised: 08/24/2012 Document Reviewed: 01/25/2012 [facility] Patient Information 2014 [place], [place].

## 2013-03-07 NOTE — MAU Note (Signed)
C/o lower abdominal and lower back pain since early this afternoon;

## 2013-03-07 NOTE — MAU Provider Note (Signed)
History     CSN: [id number]  Arrival date and time: 03/07/13 1813   First Provider Initiated Contact with Patient 03/07/13 1844      Chief Complaint  Patient presents with  . Labor Eval   HPI  Ms.Lulubelle Escamilla is 23 y.o. female [phone] at [redacted]w[redacted]d who presents today with complaints of contractions. The contractions started this afternoon and have become stronger. She is also complaining of pain when she moves, and when she changes positions. Denies vaginal bleeding or leaking of fluid. Pt says she was 3 cm in the office a few days ago. Pt was admitted to the hospital the end of February for cramping and cervical dilation.   OB History   Grav Para Term Preterm Abortions TAB SAB Ect Mult Living   4 1 1  2 1 1   1       Past Medical History  Diagnosis Date  . H/O abdominal surgery   . Endometriosis   . Other and unspecified ovarian cysts   . UTI (lower urinary tract infection)   . Chlamydia   . Gestational diabetes     Past Surgical History  Procedure Laterality Date  . Tonsillectomy    . Abdominal exploration surgery  2013    to remove cysts & adhesions  . Adenoidectomy      Family History  Problem Relation Age of Onset  . Hypertension Mother   . Diabetes Mother   . Hypertension Father   . Diabetes Father     History  Substance Use Topics  . Smoking status: Never Smoker   . Smokeless tobacco: Never Used  . Alcohol Use: No    Allergies:  Allergies  Allergen Reactions  . Doxycycline Other (See Comments)    Causes abdominal muscle spasms  . Latex Rash  . Percocet [Oxycodone-Acetaminophen] Rash    Prescriptions prior to admission  Medication Sig Dispense Refill  . acetaminophen (TYLENOL) 500 MG tablet Take 1,000 mg by mouth every 6 (six) hours as needed for headache.      . cefUROXime (CEFTIN) 500 MG tablet Take 1 tablet (500 mg total) by mouth 2 (two) times daily with a meal.  14 tablet  0  . meclizine (ANTIVERT) 12.5 MG tablet Take 1 tablet (12.5 mg  total) by mouth 3 (three) times daily.  30 tablet  1  . NIFEdipine (PROCARDIA) 10 MG capsule Take 1 capsule (10 mg total) by mouth every 6 (six) hours as needed.  30 capsule  0   Results for orders placed during the hospital encounter of 03/07/13 (from the past 48 hour(s))  FETAL FIBRONECTIN     Status: Abnormal   Collection Time    03/07/13  6:50 PM      Result Value Ref Range   Fetal Fibronectin POSITIVE (*) NEGATIVE    Review of Systems  Constitutional: Negative for fever and chills.  Gastrointestinal: Positive for abdominal pain (+ contractions ). Negative for nausea and vomiting.  Genitourinary:       No vaginal discharge. No vaginal bleeding. No dysuria.    Physical Exam   Blood pressure 126/76, pulse 98, temperature 98.4 F (36.9 C), temperature source Oral, resp. rate 20, height 5\' 1"  (1.549 m), weight 77.565 kg (171 lb).  Physical Exam  Constitutional: She is oriented to person, place, and time. She appears well-developed and well-nourished. She appears distressed.  HENT:  Head: Normocephalic.  Eyes: Pupils are equal, round, and reactive to light.  Neck: Neck supple.  Respiratory: Effort  normal.  GI: Soft. She exhibits no distension and no mass. There is no tenderness. There is no rebound and no guarding.  Genitourinary:  Speculum exam: Vagina - Small amount of creamy discharge, no odor Cervix - No contact bleeding, no active bleeding  Bimanual exam: Dilation: 1.5 Effacement (%): 50 Station: -3 Exam by:: Jerrye BushyJ Rausch NP Chaperone present for exam.   Musculoskeletal: Normal range of motion.  Neurological: She is alert and oriented to person, place, and time.  Skin: Skin is warm. She is not diaphoretic.  Psychiatric: Her mood appears anxious. Her affect is inappropriate. She is agitated and hyperactive.     Fetal Tracing: Baseline: 135 bpm Variability: Moderate  Accelerations: 15x15 Decelerations: None Toco: 2-6 mins apart; irregular pattern with UI    2000 Cervical exam unchanged from previous exam  Consulted with Dr. Clearance CootsHarper; plan to given betamethasone and send patient home with preterm labor precautions   MAU Course  Procedures None  MDM Fetal fibronectin positive   Assessment and Plan   A: Preterm labor  Positive fetal fibronectin Betamethasone given     P:  Discharge home in stable condition Pt to return in 48 hours for 2nd betamethasone injection Preterm labor precautions discussed Follow up as scheduled in the office  Continue procardia as prescribed   Iona HansenJennifer Irene Dillen Belmontes, NP  03/07/2013, 8:22 PM

## 2013-03-08 ENCOUNTER — Encounter (HOSPITAL_COMMUNITY): Payer: Self-pay | Admitting: *Deleted

## 2013-03-08 ENCOUNTER — Ambulatory Visit (INDEPENDENT_AMBULATORY_CARE_PROVIDER_SITE_OTHER): Payer: Medicaid Other | Admitting: Obstetrics & Gynecology

## 2013-03-08 ENCOUNTER — Inpatient Hospital Stay (HOSPITAL_COMMUNITY)
Admission: AD | Admit: 2013-03-08 | Discharge: 2013-03-09 | Disposition: A | Discharge status: Home / Self Care | Payer: Medicaid Other | Source: Ambulatory Visit | Attending: Obstetrics & Gynecology | Admitting: Obstetrics & Gynecology

## 2013-03-08 VITALS — BP 120/74 | Temp 98.4°F | Wt 171.0 lb

## 2013-03-08 DIAGNOSIS — Z3480 Encounter for supervision of other normal pregnancy, unspecified trimester: Secondary | ICD-10-CM

## 2013-03-08 DIAGNOSIS — O4700 False labor before 37 completed weeks of gestation, unspecified trimester: Secondary | ICD-10-CM | POA: Insufficient documentation

## 2013-03-08 DIAGNOSIS — O479 False labor, unspecified: Secondary | ICD-10-CM

## 2013-03-08 DIAGNOSIS — O365990 Maternal care for other known or suspected poor fetal growth, unspecified trimester, not applicable or unspecified: Secondary | ICD-10-CM

## 2013-03-08 LAB — URINALYSIS, ROUTINE W REFLEX MICROSCOPIC
Bilirubin Urine: NEGATIVE
Glucose, UA: NEGATIVE mg/dL
Hgb urine dipstick: NEGATIVE
Ketones, ur: NEGATIVE mg/dL
Nitrite: NEGATIVE
PROTEIN: NEGATIVE mg/dL
Specific Gravity, Urine: 1.025 (ref 1.005–1.030)
Urobilinogen, UA: 0.2 mg/dL (ref 0.0–1.0)
pH: 6 (ref 5.0–8.0)

## 2013-03-08 LAB — GLUCOSE, POCT (MANUAL RESULT ENTRY): POC Glucose: 97 mg/dl (ref 70–99)

## 2013-03-08 LAB — URINE MICROSCOPIC-ADD ON

## 2013-03-08 MED ORDER — LACTATED RINGERS IV BOLUS (SEPSIS)
1000.0000 mL | Freq: Once | INTRAVENOUS | Status: AC
  Administered 2013-03-08: 1000 mL via INTRAVENOUS

## 2013-03-08 MED ORDER — BETAMETHASONE SOD PHOS & ACET 6 (3-3) MG/ML IJ SUSP
12.0000 mg | Freq: Once | INTRAMUSCULAR | Status: AC
  Administered 2013-03-08: 12 mg via INTRAMUSCULAR
  Filled 2013-03-08: qty 2

## 2013-03-08 MED ORDER — TERBUTALINE SULFATE 1 MG/ML IJ SOLN
0.2500 mg | Freq: Once | INTRAMUSCULAR | Status: AC
  Administered 2013-03-08: 0.25 mg via SUBCUTANEOUS
  Filled 2013-03-08: qty 1

## 2013-03-08 MED ORDER — NIFEDIPINE 10 MG PO CAPS
10.0000 mg | ORAL_CAPSULE | ORAL | Status: DC | PRN
  Administered 2013-03-08 (×3): 10 mg via ORAL
  Filled 2013-03-08 (×3): qty 1

## 2013-03-08 MED ORDER — CYCLOBENZAPRINE HCL 10 MG PO TABS
10.0000 mg | ORAL_TABLET | Freq: Once | ORAL | Status: AC
  Administered 2013-03-08: 10 mg via ORAL
  Filled 2013-03-08: qty 1

## 2013-03-08 NOTE — MAU Provider Note (Signed)
Chief Complaint:  Follow-up and Contractions   First Provider Initiated Contact with Patient 03/08/13 2144.     HPI: Sara Park is a 23 y.o. [phone] at [redacted]w[redacted]d who presents to maternity admissions reporting contractions 8/10 on pain scale. Pos fFN yesterday. Here for BMZ #2. Start UC's same as yesterday.  Denies leakage of fluid or vaginal bleeding. Good fetal movement. Thinks she may be leaking urine.   Pregnancy Course: Preterm labor.   Past Medical History: Past Medical History  Diagnosis Date  . H/O abdominal surgery   . Endometriosis   . Other and unspecified ovarian cysts   . UTI (lower urinary tract infection)   . Chlamydia   . Gestational diabetes     Past obstetric history: OB History  Gravida Para Term Preterm AB SAB TAB Ectopic Multiple Living  4 1 1  2 1 1   1     # Outcome Date GA Lbr Len/2nd Weight Sex Delivery Anes PTL Lv  4 CUR           3 TRM 03/19/09 [redacted]w[redacted]d  3.232 kg (7 lb 2 oz) F SVD EPI  Y  2 SAB           1 TAB               Past Surgical History: Past Surgical History  Procedure Laterality Date  . Tonsillectomy    . Abdominal exploration surgery  2013    to remove cysts & adhesions  . Adenoidectomy       Family History: Family History  Problem Relation Age of Onset  . Hypertension Mother   . Diabetes Mother   . Hypertension Father   . Diabetes Father     Social History: History  Substance Use Topics  . Smoking status: Never Smoker   . Smokeless tobacco: Never Used  . Alcohol Use: No    Allergies:  Allergies  Allergen Reactions  . Doxycycline Other (See Comments)    Causes abdominal muscle spasms  . Latex Rash  . Percocet [Oxycodone-Acetaminophen] Rash    Meds:  Prescriptions prior to admission  Medication Sig Dispense Refill  . NIFEdipine (PROCARDIA) 10 MG capsule Take 10 mg by mouth every 6 (six) hours as needed (contractions).      . Prenatal Vit-Fe Fumarate-FA (PRENATAL MULTIVITAMIN) TABS tablet Take 1 tablet by mouth  daily at 12 noon.        ROS: Pertinent findings in history of present illness. Neg for fever, chills, urinary complaints, GI complaints.   Physical Exam  Blood pressure 119/68, pulse 112, temperature 99.6 F (37.6 C), temperature source Oral, resp. rate 20, height 5\' 1"  (1.549 m), weight 78.2 kg (172 lb 6.4 oz). GENERAL: Well-developed, well-nourished female in mild-moderate distress w/ contractions.  HEENT: normocephalic HEART: normal rate RESP: normal effort ABDOMEN: Soft, non-tender, gravid appropriate for gestational age EXTREMITIES: Nontender, no edema NEURO: alert and oriented SPECULUM EXAM: NEFG, physiologic discharge, no blood, neg pooling, cervix clean   Dilation: 1.5 Effacement (%): 50 Cervical Position: Middle Station: -1 Presentation: Vertex Exam by:: Sarajane Marek, RN  FHT:  Baseline 135 , moderate variability, accelerations present, no decelerations Contractions: q 2-4 mins, mild-moderate   Labs: Results for orders placed during the hospital encounter of 03/08/13 (from the past 24 hour(s))  URINALYSIS, ROUTINE W REFLEX MICROSCOPIC     Status: Abnormal   Collection Time    03/08/13  8:30 PM      Result Value Ref Range   Color, Urine  YELLOW  YELLOW   APPearance CLEAR  CLEAR   Specific Gravity, Urine 1.025  1.005 - 1.030   pH 6.0  5.0 - 8.0   Glucose, UA NEGATIVE  NEGATIVE mg/dL   Hgb urine dipstick NEGATIVE  NEGATIVE   Bilirubin Urine NEGATIVE  NEGATIVE   Ketones, ur NEGATIVE  NEGATIVE mg/dL   Protein, ur NEGATIVE  NEGATIVE mg/dL   Urobilinogen, UA 0.2  0.0 - 1.0 mg/dL   Nitrite NEGATIVE  NEGATIVE   Leukocytes, UA SMALL (*) NEGATIVE  URINE MICROSCOPIC-ADD ON     Status: Abnormal   Collection Time    03/08/13  8:30 PM      Result Value Ref Range   Squamous Epithelial / LPF FEW (*) RARE   WBC, UA 7-10  <3 WBC/hpf   RBC / HPF 3-6  <3 RBC/hpf   Bacteria, UA FEW (*) RARE    Imaging:  NA  MAU Course: UA, Procardia, IV bolus, BMZ.   Initially reported  improvement in UC's, then reported that they were worsening. Cervix unchanged. Flexeril and Terb ordered. UC's decreased. Discussed UC's, no cervical change w/ Dr. Tamela OddiJackson-Moore. D/C home.   Assessment: 1. Preterm uterine contractions, antepartum    Plan: Discharge home in stable condition. Preterm labor precautions and fetal kick counts. Increase fluids and rest.  Pelvic rest. Urine culture pending. Follow-up Information   [facility]. (As scheduled or, As needed, If symptoms worsen)    Contact information:   [address] [place] [id number] [phone]      [facility]. (As needed in emergencies)    Contact information:   [address] [id number] [place] [address] [phone]       Medication List    ASK your doctor about these medications       NIFEdipine 10 MG capsule  Commonly known as:  PROCARDIA  Take 10 mg by mouth every 6 (six) hours as needed (contractions).     prenatal multivitamin Tabs tablet  Take 1 tablet by mouth daily at 12 noon.       EmmettVirginia Tian Davison, [place] 03/08/2013 9:32 PM

## 2013-03-08 NOTE — MAU Note (Signed)
Pt G4 P1 at 32.1wks, returns to MAU for repeat betamethasone inj.  Pt very uncomfortable with contractions and pelvic pressure.

## 2013-03-08 NOTE — Progress Notes (Signed)
Pulse 106  Pt states that she is having increase in vaginal pressure. Pt states that she was seen in MAU on 03/07/12.  Pt states that she had a cervical exam and was 1-2/50%.  Pt also had positive FFN.  Not checking CBGs.

## 2013-03-08 NOTE — MAU Note (Signed)
Pt. Here for second dose of betamethasone. Feeling pressure and contractions today. Pt. States not as bad as yesterday but does feel pressure today and pain when sitting up in both sides and in stomach. Denies any leakage of fluid or blood. No new discharge. Baby is moving well.

## 2013-03-09 ENCOUNTER — Encounter: Payer: Medicaid Other | Admitting: Obstetrics & Gynecology

## 2013-03-09 DIAGNOSIS — O4700 False labor before 37 completed weeks of gestation, unspecified trimester: Secondary | ICD-10-CM

## 2013-03-09 NOTE — Discharge Instructions (Signed)
Braxton Hicks Contractions Pregnancy is commonly associated with contractions of the uterus throughout the pregnancy. Towards the end of pregnancy (32 to 34 weeks), these contractions (Braxton Hicks) can develop more often and may become more forceful. This is not true labor because these contractions do not result in opening (dilatation) and thinning of the cervix. They are sometimes difficult to tell apart from true labor because these contractions can be forceful and people have different pain tolerances. You should not feel embarrassed if you go to the hospital with false labor. Sometimes, the only way to tell if you are in true labor is for your caregiver to follow the changes in the cervix. How to tell the difference between true and false labor:  False labor.  The contractions of false labor are usually shorter, irregular and not as hard as those of true labor.  They are often felt in the front of the lower abdomen and in the groin.  They may leave with walking around or changing positions while lying down.  They get weaker and are shorter lasting as time goes on.  These contractions are usually irregular.  They do not usually become progressively stronger, regular and closer together as with true labor.  True labor.  Contractions in true labor last 30 to 70 seconds, become very regular, usually become more intense, and increase in frequency.  They do not go away with walking.  The discomfort is usually felt in the top of the uterus and spreads to the lower abdomen and low back.  True labor can be determined by your caregiver with an exam. This will show that the cervix is dilating and getting thinner. If there are no prenatal problems or other health problems associated with the pregnancy, it is completely safe to be sent home with false labor and await the onset of true labor. HOME CARE INSTRUCTIONS   Keep up with your usual exercises and instructions.  Take medications as  directed.  Keep your regular prenatal appointment.  Eat and drink lightly if you think you are going into labor.  If BH contractions are making you uncomfortable:  Change your activity position from lying down or resting to walking/walking to resting.  Sit and rest in a tub of warm water.  Drink 2 to 3 glasses of water. Dehydration may cause B-H contractions.  Do slow and deep breathing several times an hour. SEEK IMMEDIATE MEDICAL CARE IF:   Your contractions continue to become stronger, more regular, and closer together.  You have a gushing, burst or leaking of fluid from the vagina.  An oral temperature above 102 F (38.9 C) develops.  You have passage of blood-tinged mucus.  You develop vaginal bleeding.  You develop continuous belly (abdominal) pain.  You have low back pain that you never had before.  You feel the baby's head pushing down causing pelvic pressure.  The baby is not moving as much as it used to. Document Released: 12/22/2004 Document Revised: 03/16/2011 Document Reviewed: 10/03/2012 [employer] Patient Information 2014 [employer].  Fetal Movement Counts Patient Name: __________________________________________________ Patient Due Date: ____________________ Performing a fetal movement count is highly recommended in high-risk pregnancies, but it is good for every pregnant woman to do. Your caregiver may ask you to start counting fetal movements at 28 weeks of the pregnancy. Fetal movements often increase:  After eating a full meal.  After physical activity.  After eating or drinking something sweet or cold.  At rest. Pay attention to when you feel   the baby is most active. This will help you notice a pattern of your baby's sleep and wake cycles and what factors contribute to an increase in fetal movement. It is important to perform a fetal movement count at the same time each day when your baby is normally most active.  HOW TO COUNT FETAL  MOVEMENTS 1. Find a quiet and comfortable area to sit or lie down on your left side. Lying on your left side provides the best blood and oxygen circulation to your baby. 2. Write down the day and time on a sheet of paper or in a journal. 3. Start counting kicks, flutters, swishes, rolls, or jabs in a 2 hour period. You should feel at least 10 movements within 2 hours. 4. If you do not feel 10 movements in 2 hours, wait 2 3 hours and count again. Look for a change in the pattern or not enough counts in 2 hours. SEEK MEDICAL CARE IF:  You feel less than 10 counts in 2 hours, tried twice.  There is no movement in over an hour.  The pattern is changing or taking longer each day to reach 10 counts in 2 hours.  You feel the baby is not moving as he or she usually does. Date: ____________ Movements: ____________ Start time: ____________ Finish time: ____________  Date: ____________ Movements: ____________ Start time: ____________ Finish time: ____________ Date: ____________ Movements: ____________ Start time: ____________ Finish time: ____________ Date: ____________ Movements: ____________ Start time: ____________ Finish time: ____________ Date: ____________ Movements: ____________ Start time: ____________ Finish time: ____________ Date: ____________ Movements: ____________ Start time: ____________ Finish time: ____________ Date: ____________ Movements: ____________ Start time: ____________ Finish time: ____________ Date: ____________ Movements: ____________ Start time: ____________ Finish time: ____________  Date: ____________ Movements: ____________ Start time: ____________ Finish time: ____________ Date: ____________ Movements: ____________ Start time: ____________ Finish time: ____________ Date: ____________ Movements: ____________ Start time: ____________ Finish time: ____________ Date: ____________ Movements: ____________ Start time: ____________ Finish time: ____________ Date: ____________  Movements: ____________ Start time: ____________ Finish time: ____________ Date: ____________ Movements: ____________ Start time: ____________ Finish time: ____________ Date: ____________ Movements: ____________ Start time: ____________ Finish time: ____________  Date: ____________ Movements: ____________ Start time: ____________ Finish time: ____________ Date: ____________ Movements: ____________ Start time: ____________ Finish time: ____________ Date: ____________ Movements: ____________ Start time: ____________ Finish time: ____________ Date: ____________ Movements: ____________ Start time: ____________ Finish time: ____________ Date: ____________ Movements: ____________ Start time: ____________ Finish time: ____________ Date: ____________ Movements: ____________ Start time: ____________ Finish time: ____________ Date: ____________ Movements: ____________ Start time: ____________ Finish time: ____________  Date: ____________ Movements: ____________ Start time: ____________ Finish time: ____________ Date: ____________ Movements: ____________ Start time: ____________ Finish time: ____________ Date: ____________ Movements: ____________ Start time: ____________ Finish time: ____________ Date: ____________ Movements: ____________ Start time: ____________ Finish time: ____________ Date: ____________ Movements: ____________ Start time: ____________ Finish time: ____________ Date: ____________ Movements: ____________ Start time: ____________ Finish time: ____________ Date: ____________ Movements: ____________ Start time: ____________ Finish time: ____________  Date: ____________ Movements: ____________ Start time: ____________ Finish time: ____________ Date: ____________ Movements: ____________ Start time: ____________ Finish time: ____________ Date: ____________ Movements: ____________ Start time: ____________ Finish time: ____________ Date: ____________ Movements: ____________ Start time:  ____________ Finish time: ____________ Date: ____________ Movements: ____________ Start time: ____________ Finish time: ____________ Date: ____________ Movements: ____________ Start time: ____________ Finish time: ____________ Date: ____________ Movements: ____________ Start time: ____________ Finish time: ____________  Date: ____________ Movements: ____________ Start time: ____________ Finish time: ____________ Date: ____________ Movements: ____________ Start   time: ____________ Finish time: ____________ Date: ____________ Movements: ____________ Start time: ____________ Finish time: ____________ Date: ____________ Movements: ____________ Start time: ____________ Finish time: ____________ Date: ____________ Movements: ____________ Start time: ____________ Finish time: ____________ Date: ____________ Movements: ____________ Start time: ____________ Finish time: ____________ Date: ____________ Movements: ____________ Start time: ____________ Finish time: ____________  Date: ____________ Movements: ____________ Start time: ____________ Finish time: ____________ Date: ____________ Movements: ____________ Start time: ____________ Finish time: ____________ Date: ____________ Movements: ____________ Start time: ____________ Finish time: ____________ Date: ____________ Movements: ____________ Start time: ____________ Finish time: ____________ Date: ____________ Movements: ____________ Start time: ____________ Finish time: ____________ Date: ____________ Movements: ____________ Start time: ____________ Finish time: ____________ Date: ____________ Movements: ____________ Start time: ____________ Finish time: ____________  Date: ____________ Movements: ____________ Start time: ____________ Finish time: ____________ Date: ____________ Movements: ____________ Start time: ____________ Finish time: ____________ Date: ____________ Movements: ____________ Start time: ____________ Finish time: ____________ Date:  ____________ Movements: ____________ Start time: ____________ Finish time: ____________ Date: ____________ Movements: ____________ Start time: ____________ Finish time: ____________ Date: ____________ Movements: ____________ Start time: ____________ Finish time: ____________ Document Released: 01/21/2006 Document Revised: 12/09/2011 Document Reviewed: 10/19/2011 [employer] Patient Information 2014 [employer].  

## 2013-03-10 ENCOUNTER — Inpatient Hospital Stay (HOSPITAL_COMMUNITY)
Admission: AD | Admit: 2013-03-10 | Discharge: 2013-03-10 | Disposition: A | Discharge status: Home / Self Care | Payer: Medicaid Other | Source: Ambulatory Visit | Attending: Obstetrics & Gynecology | Admitting: Obstetrics & Gynecology

## 2013-03-10 ENCOUNTER — Encounter (HOSPITAL_COMMUNITY): Payer: Self-pay | Admitting: *Deleted

## 2013-03-10 DIAGNOSIS — O479 False labor, unspecified: Secondary | ICD-10-CM

## 2013-03-10 DIAGNOSIS — E559 Vitamin D deficiency, unspecified: Secondary | ICD-10-CM | POA: Insufficient documentation

## 2013-03-10 DIAGNOSIS — O409XX0 Polyhydramnios, unspecified trimester, not applicable or unspecified: Secondary | ICD-10-CM | POA: Insufficient documentation

## 2013-03-10 DIAGNOSIS — O4700 False labor before 37 completed weeks of gestation, unspecified trimester: Secondary | ICD-10-CM | POA: Insufficient documentation

## 2013-03-10 DIAGNOSIS — O24419 Gestational diabetes mellitus in pregnancy, unspecified control: Secondary | ICD-10-CM

## 2013-03-10 DIAGNOSIS — N898 Other specified noninflammatory disorders of vagina: Secondary | ICD-10-CM | POA: Insufficient documentation

## 2013-03-10 DIAGNOSIS — O99810 Abnormal glucose complicating pregnancy: Secondary | ICD-10-CM

## 2013-03-10 DIAGNOSIS — Z3480 Encounter for supervision of other normal pregnancy, unspecified trimester: Secondary | ICD-10-CM

## 2013-03-10 LAB — COMPREHENSIVE METABOLIC PANEL
ALBUMIN: 2.4 g/dL — AB (ref 3.5–5.2)
ALT: 8 U/L (ref 0–35)
AST: 11 U/L (ref 0–37)
Alkaline Phosphatase: 129 U/L — ABNORMAL HIGH (ref 39–117)
BUN: 8 mg/dL (ref 6–23)
CHLORIDE: 105 meq/L (ref 96–112)
CO2: 21 mEq/L (ref 19–32)
CREATININE: 0.47 mg/dL — AB (ref 0.50–1.10)
Calcium: 8.3 mg/dL — ABNORMAL LOW (ref 8.4–10.5)
GFR calc Af Amer: >90 mL/min (ref >90)
Glucose, Bld: 106 mg/dL — ABNORMAL HIGH (ref 70–99)
Potassium: 3.4 mEq/L — ABNORMAL LOW (ref 3.7–5.3)
Sodium: 138 mEq/L (ref 137–147)
Total Bilirubin: <0.2 mg/dL — ABNORMAL LOW (ref 0.3–1.2)
Total Protein: 6.4 g/dL (ref 6.0–8.3)

## 2013-03-10 LAB — CBC
HEMATOCRIT: 28.1 % — AB (ref 36.0–46.0)
Hemoglobin: 9.1 g/dL — ABNORMAL LOW (ref 12.0–15.0)
MCH: 23.7 pg — ABNORMAL LOW (ref 26.0–34.0)
MCHC: 32.4 g/dL (ref 30.0–36.0)
MCV: 73.2 fL — ABNORMAL LOW (ref 78.0–100.0)
Platelets: 237 10*3/uL (ref 150–400)
RBC: 3.84 MIL/uL — ABNORMAL LOW (ref 3.87–5.11)
RDW: 15.9 % — AB (ref 11.5–15.5)
WBC: 11.7 10*3/uL — AB (ref 4.0–10.5)

## 2013-03-10 LAB — URINALYSIS, ROUTINE W REFLEX MICROSCOPIC
Bilirubin Urine: NEGATIVE
GLUCOSE, UA: NEGATIVE mg/dL
Hgb urine dipstick: NEGATIVE
Ketones, ur: NEGATIVE mg/dL
Leukocytes, UA: NEGATIVE
NITRITE: NEGATIVE
PROTEIN: NEGATIVE mg/dL
Specific Gravity, Urine: 1.02 (ref 1.005–1.030)
UROBILINOGEN UA: 1 mg/dL (ref 0.0–1.0)
pH: 6.5 (ref 5.0–8.0)

## 2013-03-10 LAB — WET PREP, GENITAL
Clue Cells Wet Prep HPF POC: NONE SEEN
Trich, Wet Prep: NONE SEEN
YEAST WET PREP: NONE SEEN

## 2013-03-10 MED ORDER — LACTATED RINGERS IV BOLUS (SEPSIS)
1000.0000 mL | Freq: Once | INTRAVENOUS | Status: AC
  Administered 2013-03-10: 1000 mL via INTRAVENOUS

## 2013-03-10 MED ORDER — CYCLOBENZAPRINE HCL 10 MG PO TABS
10.0000 mg | ORAL_TABLET | Freq: Once | ORAL | Status: AC
  Administered 2013-03-10: 10 mg via ORAL
  Filled 2013-03-10: qty 1

## 2013-03-10 NOTE — Patient Instructions (Signed)
Preterm Labor Information Preterm labor is when labor starts at less than 37 weeks of pregnancy. The normal length of a pregnancy is 39 to 41 weeks. CAUSES Often, there is no identifiable underlying cause as to why a woman goes into preterm labor. One of the most common known causes of preterm labor is infection. Infections of the uterus, cervix, vagina, amniotic sac, bladder, kidney, or even the lungs (pneumonia) can cause labor to start. Other suspected causes of preterm labor include:   Urogenital infections, such as yeast infections and bacterial vaginosis.   Uterine abnormalities (uterine shape, uterine septum, fibroids, or bleeding from the placenta).   A cervix that has been operated on (it may fail to stay closed).   Malformations in the fetus.   Multiple gestations (twins, triplets, and so on).   Breakage of the amniotic sac.  RISK FACTORS  Having a previous history of preterm labor.   Having premature rupture of membranes (PROM).   Having a placenta that covers the opening of the cervix (placenta previa).   Having a placenta that separates from the uterus (placental abruption).   Having a cervix that is too weak to hold the fetus in the uterus (incompetent cervix).   Having too much fluid in the amniotic sac (polyhydramnios).   Taking illegal drugs or smoking while pregnant.   Not gaining enough weight while pregnant.   Being younger than 18 and older than 24 years old.   Having a low socioeconomic status.   Being African American. SYMPTOMS Signs and symptoms of preterm labor include:   Menstrual-like cramps, abdominal pain, or back pain.  Uterine contractions that are regular, as frequent as six in an hour, regardless of their intensity (may be mild or painful).  Contractions that start on the top of the uterus and spread down to the lower abdomen and back.   A sense of increased pelvic pressure.   A watery or bloody mucus discharge that  comes from the vagina.  TREATMENT Depending on the length of the pregnancy and other circumstances, your health care provider may suggest bed rest. If necessary, there are medicines that can be given to stop contractions and to mature the fetal lungs. If labor happens before 34 weeks of pregnancy, a prolonged hospital stay may be recommended. Treatment depends on the condition of both you and the fetus.  WHAT SHOULD YOU DO IF YOU THINK YOU ARE IN PRETERM LABOR? Call your health care provider right away. You will need to go to the hospital to get checked immediately. HOW CAN YOU PREVENT PRETERM LABOR IN FUTURE PREGNANCIES? You should:   Stop smoking if you smoke.  Maintain healthy weight gain and avoid chemicals and drugs that are not necessary.  Be watchful for any type of infection.  Inform your health care provider if you have a known history of preterm labor. Document Released: 03/14/2003 Document Revised: 08/24/2012 Document Reviewed: 01/25/2012 [employer] Patient Information 2014 [employer].    

## 2013-03-10 NOTE — Discharge Instructions (Signed)
Preterm Labor Information Preterm labor is when labor starts at less than 37 weeks of pregnancy. The normal length of a pregnancy is 39 to 41 weeks. CAUSES Often, there is no identifiable underlying cause as to why a woman goes into preterm labor. One of the most common known causes of preterm labor is infection. Infections of the uterus, cervix, vagina, amniotic sac, bladder, kidney, or even the lungs (pneumonia) can cause labor to start. Other suspected causes of preterm labor include:   Urogenital infections, such as yeast infections and bacterial vaginosis.   Uterine abnormalities (uterine shape, uterine septum, fibroids, or bleeding from the placenta).   A cervix that has been operated on (it may fail to stay closed).   Malformations in the fetus.   Multiple gestations (twins, triplets, and so on).   Breakage of the amniotic sac.  RISK FACTORS  Having a previous history of preterm labor.   Having premature rupture of membranes (PROM).   Having a placenta that covers the opening of the cervix (placenta previa).   Having a placenta that separates from the uterus (placental abruption).   Having a cervix that is too weak to hold the fetus in the uterus (incompetent cervix).   Having too much fluid in the amniotic sac (polyhydramnios).   Taking illegal drugs or smoking while pregnant.   Not gaining enough weight while pregnant.   Being younger than 18 and older than 24 years old.   Having a low socioeconomic status.   Being African American. SYMPTOMS Signs and symptoms of preterm labor include:   Menstrual-like cramps, abdominal pain, or back pain.  Uterine contractions that are regular, as frequent as six in an hour, regardless of their intensity (may be mild or painful).  Contractions that start on the top of the uterus and spread down to the lower abdomen and back.   A sense of increased pelvic pressure.   A watery or bloody mucus discharge that  comes from the vagina.  TREATMENT Depending on the length of the pregnancy and other circumstances, your health care provider may suggest bed rest. If necessary, there are medicines that can be given to stop contractions and to mature the fetal lungs. If labor happens before 34 weeks of pregnancy, a prolonged hospital stay may be recommended. Treatment depends on the condition of both you and the fetus.  WHAT SHOULD YOU DO IF YOU THINK YOU ARE IN PRETERM LABOR? Call your health care provider right away. You will need to go to the hospital to get checked immediately. HOW CAN YOU PREVENT PRETERM LABOR IN FUTURE PREGNANCIES? You should:   Stop smoking if you smoke.  Maintain healthy weight gain and avoid chemicals and drugs that are not necessary.  Be watchful for any type of infection.  Inform your health care provider if you have a known history of preterm labor. Document Released: 03/14/2003 Document Revised: 08/24/2012 Document Reviewed: 01/25/2012 [employer] Patient Information 2014 [employer].    

## 2013-03-10 NOTE — MAU Provider Note (Signed)
History     CSN: [id number]  Arrival date and time: 03/10/13 1554   First Provider Initiated Contact with Patient 03/10/13 1705      Chief Complaint  Patient presents with  . Labor Eval  . Vaginal Discharge   Vaginal Discharge The patient's primary symptoms include a vaginal discharge.    Sara Park is a 23 y.o. [phone] at [redacted]w[redacted]d who presents today with contractions. She states that she has been here off and on all week, and has had BMZ x 2, and was given procardia for contractions. She has been taking procardia q6 hours, but is continuing to have contractions. She states that she also woke up in a puddle of wetness this morning, but has not had leaking since then. She is not sure if it was urine or if her water broke.   Past Medical History  Diagnosis Date  . H/O abdominal surgery   . Endometriosis   . Other and unspecified ovarian cysts   . UTI (lower urinary tract infection)   . Chlamydia   . Gestational diabetes     Past Surgical History  Procedure Laterality Date  . Tonsillectomy    . Abdominal exploration surgery  2013    to remove cysts & adhesions  . Adenoidectomy      Family History  Problem Relation Age of Onset  . Hypertension Mother   . Diabetes Mother   . Hypertension Father   . Diabetes Father     History  Substance Use Topics  . Smoking status: Never Smoker   . Smokeless tobacco: Never Used  . Alcohol Use: No    Allergies:  Allergies  Allergen Reactions  . Doxycycline Other (See Comments)    Causes abdominal muscle spasms  . Latex Rash  . Percocet [Oxycodone-Acetaminophen] Rash    Prescriptions prior to admission  Medication Sig Dispense Refill  . NIFEdipine (PROCARDIA) 10 MG capsule Take 10 mg by mouth every 6 (six) hours as needed (contractions).      . Prenatal Vit-Fe Fumarate-FA (PRENATAL MULTIVITAMIN) TABS tablet Take 1 tablet by mouth daily at 12 noon.        Review of Systems  Genitourinary: Positive for vaginal discharge.    Physical Exam   Blood pressure 121/69, pulse 101, temperature 98.5 F (36.9 C), resp. rate 20, height 5\' 1"  (1.549 m), weight 78.926 kg (174 lb), SpO2 100.00%.  Physical Exam  Nursing note and vitals reviewed. Constitutional: She is oriented to person, place, and time. She appears well-developed and well-nourished. No distress.  Cardiovascular: Normal rate.   Respiratory: Effort normal.  GI: Soft. There is no tenderness.  Genitourinary:   External: no lesion Vagina: small amount of white discharge, no pooling of fluid seen Cervix: pink, smooth, no CMT, 2/50/-3/vtx Uterus: AGA   Neurological: She is alert and oriented to person, place, and time.  Skin: Skin is warm and dry.  Psychiatric: She has a normal mood and affect.   FHT: 140, moderate with 15 x 15 accels, no decels Toco: Ctx q 4 mins with UI in between contractions.   1923: 145, moderate with 15x15 accels, no decels Toco: UI has stopped, ctx about q 4-15 mins Cervix: unchanged MAU Course  Procedures  Results for orders placed during the hospital encounter of 03/10/13 (from the past 24 hour(s))  CBC     Status: Abnormal   Collection Time    03/10/13  4:26 PM      Result Value Ref Range   WBC  11.7 (*) 4.0 - 10.5 K/uL   RBC 3.84 (*) 3.87 - 5.11 MIL/uL   Hemoglobin 9.1 (*) 12.0 - 15.0 g/dL   HCT 16.128.1 (*) 09.636.0 - 04.546.0 %   MCV 73.2 (*) 78.0 - 100.0 fL   MCH 23.7 (*) 26.0 - 34.0 pg   MCHC 32.4  30.0 - 36.0 g/dL   RDW 40.915.9 (*) 81.111.5 - 91.415.5 %   Platelets 237  150 - 400 K/uL  COMPREHENSIVE METABOLIC PANEL     Status: Abnormal   Collection Time    03/10/13  4:26 PM      Result Value Ref Range   Sodium 138  137 - 147 mEq/L   Potassium 3.4 (*) 3.7 - 5.3 mEq/L   Chloride 105  96 - 112 mEq/L   CO2 21  19 - 32 mEq/L   Glucose, Bld 106 (*) 70 - 99 mg/dL   BUN 8  6 - 23 mg/dL   Creatinine, Ser 7.820.47 (*) 0.50 - 1.10 mg/dL   Calcium 8.3 (*) 8.4 - 10.5 mg/dL   Total Protein 6.4  6.0 - 8.3 g/dL   Albumin 2.4 (*) 3.5 - 5.2  g/dL   AST 11  0 - 37 U/L   ALT 8  0 - 35 U/L   Alkaline Phosphatase 129 (*) 39 - 117 U/L   Total Bilirubin <0.2 (*) 0.3 - 1.2 mg/dL   GFR calc non Af Amer >90  >90 mL/min   GFR calc Af Amer >90  >90 mL/min  URINALYSIS, ROUTINE W REFLEX MICROSCOPIC     Status: None   Collection Time    03/10/13  4:45 PM      Result Value Ref Range   Color, Urine YELLOW  YELLOW   APPearance CLEAR  CLEAR   Specific Gravity, Urine 1.020  1.005 - 1.030   pH 6.5  5.0 - 8.0   Glucose, UA NEGATIVE  NEGATIVE mg/dL   Hgb urine dipstick NEGATIVE  NEGATIVE   Bilirubin Urine NEGATIVE  NEGATIVE   Ketones, ur NEGATIVE  NEGATIVE mg/dL   Protein, ur NEGATIVE  NEGATIVE mg/dL   Urobilinogen, UA 1.0  0.0 - 1.0 mg/dL   Nitrite NEGATIVE  NEGATIVE   Leukocytes, UA NEGATIVE  NEGATIVE   1930: D/W Dr. Tamela OddiJackson-Moore, ok for dc home at this time.   Assessment and Plan   1. Gestational diabetes   2. Polyhydramnios   3. Supervision of other normal pregnancy   4. Unspecified vitamin D deficiency   5. Labor, false (Braxton-Hicks), antepartum   6. Preterm contractions    PTL precautions Fetal kick counts Return to MAU as needed  Follow-up Information   Follow up with Roseanna RainbowJACKSON-MOORE,LISA A, MD. (As scheduled)    Specialty:  Obstetrics and Gynecology   Contact information:   [address] [place] [id number] [phone]        Tawnya CrookHogan, Earlena Werst Donovan 03/10/2013, 5:25 PM

## 2013-03-10 NOTE — MAU Note (Signed)
Patient states she woke up at 0700 this am with a "big wet spot" on sheets. Has a discharge since but is not wearing a pad. States she is having contractions every 10 minutes with a lot of pressure. Has sharp "knife like" pain in the vagina. Reports good fetal movement, no bleeding.

## 2013-03-13 ENCOUNTER — Encounter (HOSPITAL_COMMUNITY): Payer: Self-pay | Admitting: *Deleted

## 2013-03-13 ENCOUNTER — Inpatient Hospital Stay (HOSPITAL_COMMUNITY)
Admission: AD | Admit: 2013-03-13 | Discharge: 2013-03-13 | Disposition: A | Discharge status: Home / Self Care | Payer: Medicaid Other | Source: Ambulatory Visit | Attending: Obstetrics | Admitting: Obstetrics

## 2013-03-13 DIAGNOSIS — O479 False labor, unspecified: Secondary | ICD-10-CM

## 2013-03-13 DIAGNOSIS — O4700 False labor before 37 completed weeks of gestation, unspecified trimester: Secondary | ICD-10-CM | POA: Insufficient documentation

## 2013-03-13 MED ORDER — TERBUTALINE SULFATE 1 MG/ML IJ SOLN
0.2500 mg | Freq: Once | INTRAMUSCULAR | Status: AC
  Administered 2013-03-13: 0.25 mg via SUBCUTANEOUS
  Filled 2013-03-13: qty 1

## 2013-03-13 NOTE — Discharge Instructions (Signed)

## 2013-03-13 NOTE — MAU Note (Signed)
Patient presents with complaint of labor pains since 1000 yesterday morning.

## 2013-03-13 NOTE — MAU Provider Note (Signed)
History     CSN: [id number]  Arrival date and time: 03/13/13 1656   None     Chief Complaint  Patient presents with  . Labor Eval   HPI  [name] is a 23 y.o. female [phone] at 4180w6d who presents with contractions and pelvic pressure. The patient states she has been to MAU at least four times in the last week due to contraction pain. Pt is taking procardia and flexeril as prescribed.  Patient had a positive fetal fibronectin last week in MAU. The last time she was here was 3/6; cervix was unchanged. She reports good fetal movement, denies LOF, vaginal bleeding, vaginal itching/burning, urinary symptoms, h/a, dizziness, n/v, or fever/chills.    OB History   Grav Para Term Preterm Abortions TAB SAB Ect Mult Living   4 1 0 1 2 1 1   1       Past Medical History  Diagnosis Date  . H/O abdominal surgery   . Endometriosis   . Other and unspecified ovarian cysts   . UTI (lower urinary tract infection)   . Chlamydia   . Gestational diabetes     Past Surgical History  Procedure Laterality Date  . Tonsillectomy    . Abdominal exploration surgery  2013    to remove cysts & adhesions  . Adenoidectomy      Family History  Problem Relation Age of Onset  . Hypertension Mother   . Diabetes Mother   . Hypertension Father   . Diabetes Father     History  Substance Use Topics  . Smoking status: Never Smoker   . Smokeless tobacco: Never Used  . Alcohol Use: No    Allergies:  Allergies  Allergen Reactions  . Doxycycline Other (See Comments)    Causes abdominal muscle spasms  . Latex Rash  . Percocet [Oxycodone-Acetaminophen] Rash    Prescriptions prior to admission  Medication Sig Dispense Refill  . Prenatal Vit-Fe Fumarate-FA (PRENATAL MULTIVITAMIN) TABS tablet Take 1 tablet by mouth daily at 12 noon.       Review of Systems  Constitutional: Negative for fever and chills.  Gastrointestinal: Positive for abdominal pain (+ pelvic pressure, contraction pain  ). Negative for nausea and vomiting.  Genitourinary: Negative for dysuria, urgency, frequency and hematuria.       No vaginal discharge. No vaginal bleeding. No dysuria.    Physical Exam   Blood pressure 90/68, pulse 115, temperature 98.7 F (37.1 C), temperature source Oral, resp. rate 22, height 5\' 1"  (1.549 m), weight 78.926 kg (174 lb).  Physical Exam  Constitutional: She is oriented to person, place, and time. She appears well-developed and well-nourished. No distress.  HENT:  Head: Normocephalic.  Eyes: Pupils are equal, round, and reactive to light.  Neck: Neck supple.  Respiratory: Effort normal.  GI: Soft.  Fundus is soft   Genitourinary:  Cervix: 1.5 cm, 50%, middle. Ballotable No blood noted on glove   Musculoskeletal: Normal range of motion.  Neurological: She is alert and oriented to person, place, and time.  Skin: Skin is warm. She is not diaphoretic.  Psychiatric: Her behavior is normal. Her mood appears anxious.    Fetal Tracing: Baseline: 145 bpm  Variability: Moderate  Accelerations: 15x15 Decelerations: None Toco:  MAU Course  Procedures None   MDM NST Terbutaline 0.25 subQ given  Pt requesting to go home following terbutaline  Consulted with Dr. Gaynell FaceMarshall; discharge patient home with preterm labor precautions   Assessment and Plan  A: Braxton hicks contractions     Preterm labor; no change in cervical dilation   P: Discharge home in stable condition Follow up with Dr. Clearance Coots  Continue procardia as prescribed Preterm labor precautions discussed at length Kick counts   Iona Hansen Alazay Leicht, NP  03/16/2013, 8:40 AM

## 2013-03-15 ENCOUNTER — Encounter: Payer: Medicaid Other | Admitting: Obstetrics & Gynecology

## 2013-03-16 ENCOUNTER — Ambulatory Visit (INDEPENDENT_AMBULATORY_CARE_PROVIDER_SITE_OTHER): Payer: Medicaid Other | Admitting: Obstetrics & Gynecology

## 2013-03-16 VITALS — BP 125/76 | Temp 99.2°F | Wt 174.0 lb

## 2013-03-16 DIAGNOSIS — O24419 Gestational diabetes mellitus in pregnancy, unspecified control: Secondary | ICD-10-CM

## 2013-03-16 DIAGNOSIS — O99810 Abnormal glucose complicating pregnancy: Secondary | ICD-10-CM

## 2013-03-16 DIAGNOSIS — Z3480 Encounter for supervision of other normal pregnancy, unspecified trimester: Secondary | ICD-10-CM

## 2013-03-16 LAB — POCT URINALYSIS DIPSTICK
BILIRUBIN UA: NEGATIVE
Glucose, UA: NEGATIVE
KETONES UA: NEGATIVE
Nitrite, UA: NEGATIVE
PH UA: 8
RBC UA: NEGATIVE
Urobilinogen, UA: NEGATIVE

## 2013-03-16 LAB — GLUCOSE, POCT (MANUAL RESULT ENTRY): POC Glucose: 80 mg/dl (ref 70–99)

## 2013-03-16 NOTE — Progress Notes (Signed)
Pulse: 108 Patient states she is feeling the baby move but not as much as normal. Patient states she was extremely sick yesterday, she had vomiting and diarrhea all day. Patient states " she just feels miserable right now". Patient states is having excruciating lower abdominal and pelvic pain and pressure. Patient states there is on spot on her stomach that is extremely tender. Patient states " she feels like he could just fall out."?compliance with CBG testing.

## 2013-03-21 ENCOUNTER — Ambulatory Visit (INDEPENDENT_AMBULATORY_CARE_PROVIDER_SITE_OTHER): Payer: Medicaid Other | Admitting: Obstetrics

## 2013-03-21 ENCOUNTER — Encounter: Payer: Self-pay | Admitting: Obstetrics

## 2013-03-21 ENCOUNTER — Encounter: Payer: Medicaid Other | Admitting: Advanced Practice Midwife

## 2013-03-21 VITALS — BP 126/77 | Temp 98.7°F | Wt 175.0 lb

## 2013-03-21 DIAGNOSIS — O365990 Maternal care for other known or suspected poor fetal growth, unspecified trimester, not applicable or unspecified: Secondary | ICD-10-CM

## 2013-03-21 DIAGNOSIS — Z3480 Encounter for supervision of other normal pregnancy, unspecified trimester: Secondary | ICD-10-CM

## 2013-03-21 LAB — POCT URINALYSIS DIPSTICK
Bilirubin, UA: NEGATIVE
GLUCOSE UA: NEGATIVE
KETONES UA: NEGATIVE
Nitrite, UA: NEGATIVE
Spec Grav, UA: 1.015
Urobilinogen, UA: NEGATIVE
pH, UA: 6

## 2013-03-21 NOTE — Progress Notes (Signed)
Pulse: 108 Patient states she is taking procardia but does not know the dosage. Patient states the contractions were 4-5 minutes apart from about 10:00 this morning until about 2:30 and then they became speratic.

## 2013-03-23 ENCOUNTER — Inpatient Hospital Stay (HOSPITAL_COMMUNITY)
Admission: AD | Admit: 2013-03-23 | Discharge: 2013-03-23 | Disposition: A | Discharge status: Home / Self Care | Payer: Medicaid Other | Source: Ambulatory Visit | Attending: Obstetrics & Gynecology | Admitting: Obstetrics & Gynecology

## 2013-03-23 ENCOUNTER — Encounter (HOSPITAL_COMMUNITY): Payer: Self-pay | Admitting: *Deleted

## 2013-03-23 DIAGNOSIS — O4700 False labor before 37 completed weeks of gestation, unspecified trimester: Secondary | ICD-10-CM | POA: Insufficient documentation

## 2013-03-23 DIAGNOSIS — O9989 Other specified diseases and conditions complicating pregnancy, childbirth and the puerperium: Principal | ICD-10-CM

## 2013-03-23 DIAGNOSIS — Z3480 Encounter for supervision of other normal pregnancy, unspecified trimester: Secondary | ICD-10-CM

## 2013-03-23 DIAGNOSIS — N949 Unspecified condition associated with female genital organs and menstrual cycle: Secondary | ICD-10-CM | POA: Insufficient documentation

## 2013-03-23 DIAGNOSIS — O24419 Gestational diabetes mellitus in pregnancy, unspecified control: Secondary | ICD-10-CM

## 2013-03-23 DIAGNOSIS — O409XX0 Polyhydramnios, unspecified trimester, not applicable or unspecified: Secondary | ICD-10-CM

## 2013-03-23 DIAGNOSIS — O99891 Other specified diseases and conditions complicating pregnancy: Secondary | ICD-10-CM | POA: Insufficient documentation

## 2013-03-23 DIAGNOSIS — O99810 Abnormal glucose complicating pregnancy: Secondary | ICD-10-CM

## 2013-03-23 DIAGNOSIS — E559 Vitamin D deficiency, unspecified: Secondary | ICD-10-CM

## 2013-03-23 LAB — WET PREP, GENITAL
Clue Cells Wet Prep HPF POC: NONE SEEN
TRICH WET PREP: NONE SEEN
YEAST WET PREP: NONE SEEN

## 2013-03-23 LAB — OB RESULTS CONSOLE GC/CHLAMYDIA
Chlamydia: NEGATIVE
Gonorrhea: NEGATIVE

## 2013-03-23 LAB — AMNISURE RUPTURE OF MEMBRANE (ROM) NOT AT ARMC: Amnisure ROM: NEGATIVE

## 2013-03-23 NOTE — MAU Note (Signed)
C/o ucs and ? Vaginal leaking @ 0300 this AM; also c/o sharp vaginal pain;

## 2013-03-23 NOTE — MAU Provider Note (Signed)
History     CSN: [id number]  Arrival date and time: 03/23/13 1302   First Provider Initiated Contact with Patient 03/23/13 1330      Chief Complaint  Patient presents with  . Labor Eval   HPI  Pt is a 23 yo G4P0121 at 4661w2d wks IUP here with report of thin watery vaginal discharge that started early this AM.  Pt states has changed underwear 4 times.  Intermittent contractions, but nothing consistent.  No report of vaginal bleeding, but increased sharp stabbing pain in the vagina with walking.    Past Medical History  Diagnosis Date  . H/O abdominal surgery   . Endometriosis   . Other and unspecified ovarian cysts   . UTI (lower urinary tract infection)   . Chlamydia   . Gestational diabetes     Past Surgical History  Procedure Laterality Date  . Tonsillectomy    . Abdominal exploration surgery  2013    to remove cysts & adhesions  . Adenoidectomy      Family History  Problem Relation Age of Onset  . Hypertension Mother   . Diabetes Mother   . Hypertension Father   . Diabetes Father     History  Substance Use Topics  . Smoking status: Never Smoker   . Smokeless tobacco: Never Used  . Alcohol Use: No    Allergies:  Allergies  Allergen Reactions  . Doxycycline Other (See Comments)    Causes abdominal muscle spasms  . Latex Rash  . Percocet [Oxycodone-Acetaminophen] Rash    No prescriptions prior to admission    Review of Systems  Gastrointestinal: Positive for abdominal pain (intermittent contractions).  Genitourinary:       Vaginal pain; thin vaginal discharge.  All other systems reviewed and are negative.   Physical Exam   Blood pressure 126/67, pulse 117, temperature 98.8 F (37.1 C), temperature source Oral, resp. rate 20, height 5\' 1"  (1.549 m), weight 79.379 kg (175 lb).  Physical Exam  Constitutional: She is oriented to person, place, and time. She appears well-developed and well-nourished. No distress.  HENT:  Head: Normocephalic.   Neck: Normal range of motion. Neck supple.  Cardiovascular: Normal rate, regular rhythm and normal heart sounds.   Respiratory: Effort normal and breath sounds normal.  GI: Soft. There is no tenderness.  Genitourinary: There is bleeding (bleeding started after wet prep/GC and CT collection; cervix appears friable) around the vagina. Vaginal discharge (mucusy) found.  Creamy white/yellowish discharge; negative pooling, negative ferning  Musculoskeletal: Normal range of motion. She exhibits no edema.  Neurological: She is alert and oriented to person, place, and time.  Skin: Skin is warm and dry.   Dilation: 1.5 Effacement (%): 20 Cervical Position: Middle Station: -3 Presentation: Vertex Exam by:: Roney MarionW. Muhammad, CNM (same as office) MAU Course  Procedures  Results for orders placed during the hospital encounter of 03/23/13 (from the past 24 hour(s))  WET PREP, GENITAL     Status: Abnormal   Collection Time    03/23/13  1:40 PM      Result Value Ref Range   Yeast Wet Prep HPF POC NONE SEEN  NONE SEEN   Trich, Wet Prep NONE SEEN  NONE SEEN   Clue Cells Wet Prep HPF POC NONE SEEN  NONE SEEN   WBC, Wet Prep HPF POC MANY (*) NONE SEEN   Results for orders placed during the hospital encounter of 03/23/13 (from the past 24 hour(s))  WET PREP, GENITAL  Status: Abnormal   Collection Time    03/23/13  1:40 PM      Result Value Ref Range   Yeast Wet Prep HPF POC NONE SEEN  NONE SEEN   Trich, Wet Prep NONE SEEN  NONE SEEN   Clue Cells Wet Prep HPF POC NONE SEEN  NONE SEEN   WBC, Wet Prep HPF POC MANY (*) NONE SEEN  AMNISURE RUPTURE OF MEMBRANE (ROM)     Status: None   Collection Time    03/23/13  2:40 PM      Result Value Ref Range   Amnisure ROM NEGATIVE     Consulted with Dr. Gaynell Face > obtain amnisure and discharge home if negative.  Assessment and Plan  23 yo G4P0121 at [redacted]w[redacted]d wks IUP Vaginal Discharge Category I FHR Tracing  Discharge to home Reviewed preterm labor  precautions.  [facility] 03/23/2013, 1:32 PM

## 2013-03-24 LAB — GC/CHLAMYDIA PROBE AMP
CT PROBE, AMP APTIMA: NEGATIVE
GC Probe RNA: NEGATIVE

## 2013-03-28 ENCOUNTER — Ambulatory Visit (INDEPENDENT_AMBULATORY_CARE_PROVIDER_SITE_OTHER): Payer: Medicaid Other | Admitting: Advanced Practice Midwife

## 2013-03-28 VITALS — Temp 99.6°F | Wt 179.0 lb

## 2013-03-28 DIAGNOSIS — O99810 Abnormal glucose complicating pregnancy: Secondary | ICD-10-CM

## 2013-03-28 DIAGNOSIS — O24419 Gestational diabetes mellitus in pregnancy, unspecified control: Secondary | ICD-10-CM

## 2013-03-28 DIAGNOSIS — Z3480 Encounter for supervision of other normal pregnancy, unspecified trimester: Secondary | ICD-10-CM

## 2013-03-28 LAB — GLUCOSE, POCT (MANUAL RESULT ENTRY): POC Glucose: 103 mg/dl — AB (ref 70–99)

## 2013-03-28 NOTE — Progress Notes (Signed)
Subjective: Sara Park is a 23 y.o. at 35 weeks by early ultrasound  Patient denies vaginal leaking of fluid or bleeding.  Reports positive fetal movment.   Reports mucus discharge. States she has been contracting since Saturday, they were frequent last night and have spaced out today. Visably uncomfortable, having difficulty sitting in general. Difficult to sit still for NST. States she is not checking her sugars because she hasn't got her   Objective: Filed Vitals:   03/28/13 1539  Temp: 99.6 F (37.6 C)   140 FHR 36 Fundal Height Fetal Position cephalic VE 3/80/Ballotable, BOW palpated, no bleeding seen  NST FHR 140, + accel, negative decel, mod variability Toco Contractions every 10-15 min  Abdomen soft, NT Random glucose 103  Assessment: Patient Active Problem List   Diagnosis Date Noted  . Pregnant 02/23/2013  . Preterm contractions 02/22/2013  . Gestational diabetes 02/12/2013  . Polyhydramnios 01/10/2013  . Supervision of other normal pregnancy 09/22/2012  . LGSIL (low grade squamous intraepithelial lesion) on Pap smear 09/16/2012  . Unspecified vitamin D deficiency 09/16/2012  Reactive and reassuring NST  Plan: Patient to return to clinic in Thursday for NST Reviewed methods to help w/ irregular contractions. Encouraged hydration, warm bath, rest, etc. Patient to go to MAU PRN, warning signs, signs of labor, etc discussed Encouraged patient to start checking sugars, currently noncompliant. Reviewed risk to fetus.  Reviewed warning signs in pregnancy. Patient to call with concerns PRN. Reviewed triage location.  30 min spent with patient greater than 80% spent in counseling and coordination of care.    Sebastian Lurz Wilson SingerWren CNM

## 2013-03-28 NOTE — Progress Notes (Signed)
Pulse- 114 Pt states she has been having contractions since Saturday. Pt states she noticed some small amount of vaginal bleeding 03-25-13 and then again 03-27-13. Pt states fetal movement has stayed active. Pt is not sure how far apart her contractions are.

## 2013-03-29 ENCOUNTER — Inpatient Hospital Stay (HOSPITAL_COMMUNITY)
Admission: AD | Admit: 2013-03-29 | Discharge: 2013-03-29 | Disposition: A | Discharge status: Home / Self Care | Payer: Medicaid Other | Source: Ambulatory Visit | Attending: Obstetrics & Gynecology | Admitting: Obstetrics & Gynecology

## 2013-03-29 ENCOUNTER — Encounter (HOSPITAL_COMMUNITY): Payer: Self-pay | Admitting: *Deleted

## 2013-03-29 DIAGNOSIS — O479 False labor, unspecified: Secondary | ICD-10-CM

## 2013-03-29 DIAGNOSIS — O4700 False labor before 37 completed weeks of gestation, unspecified trimester: Secondary | ICD-10-CM

## 2013-03-29 DIAGNOSIS — R109 Unspecified abdominal pain: Secondary | ICD-10-CM | POA: Insufficient documentation

## 2013-03-29 LAB — WET PREP, GENITAL
CLUE CELLS WET PREP: NONE SEEN
TRICH WET PREP: NONE SEEN
Yeast Wet Prep HPF POC: NONE SEEN

## 2013-03-29 MED ORDER — HYDROCODONE-ACETAMINOPHEN 5-325 MG PO TABS
2.0000 | ORAL_TABLET | ORAL | Status: AC
  Administered 2013-03-29: 2 via ORAL
  Filled 2013-03-29: qty 2

## 2013-03-29 MED ORDER — IPRATROPIUM BROMIDE 0.02 % IN SOLN: 0.5000 mg | Freq: Once | RESPIRATORY_TRACT | Status: DC

## 2013-03-29 MED ORDER — ALBUTEROL SULFATE (2.5 MG/3ML) 0.083% IN NEBU: 2.5000 mg | INHALATION_SOLUTION | Freq: Once | RESPIRATORY_TRACT | Status: DC

## 2013-03-29 NOTE — MAU Provider Note (Signed)
Chief Complaint:  Vaginal Bleeding and Abdominal Cramping   First Provider Initiated Contact with Patient 03/29/13 1450      HPI: Sara Park is a 23 y.o. [phone] at [redacted]w[redacted]d who presents to maternity admissions reporting abdominal cramping off and on x4 days, worsening today with bright red spotting this morning.  She reports good fetal movement, denies LOF, vaginal itching/burning, urinary symptoms, h/a, dizziness, n/v, or fever/chills.     Past Medical History: Past Medical History  Diagnosis Date  . H/O abdominal surgery   . Endometriosis   . Other and unspecified ovarian cysts   . UTI (lower urinary tract infection)   . Chlamydia   . Gestational diabetes     Past obstetric history: OB History  Gravida Para Term Preterm AB SAB TAB Ectopic Multiple Living  4 1 0 1 2 1 1   1     # Outcome Date GA Lbr Len/2nd Weight Sex Delivery Anes PTL Lv  4 CUR           3 PRE 03/19/09 [redacted]w[redacted]d  7 lb 2 oz (3.232 kg) F SVD EPI Y Y  2 SAB           1 TAB               Past Surgical History: Past Surgical History  Procedure Laterality Date  . Tonsillectomy    . Abdominal exploration surgery  2013    to remove cysts & adhesions  . Adenoidectomy      Family History: Family History  Problem Relation Age of Onset  . Hypertension Mother   . Diabetes Mother   . Hypertension Father   . Diabetes Father     Social History: History  Substance Use Topics  . Smoking status: Never Smoker   . Smokeless tobacco: Never Used  . Alcohol Use: No    Allergies:  Allergies  Allergen Reactions  . Doxycycline Other (See Comments)    Causes abdominal muscle spasms  . Latex Rash  . Percocet [Oxycodone-Acetaminophen] Rash    Per pt, tolerates vicodin    Meds:  Prescriptions prior to admission  Medication Sig Dispense Refill  . diphenhydrAMINE (BENADRYL) 25 mg capsule Take 25 mg by mouth every 6 (six) hours as needed (for allergic reaction).        ROS: Pertinent findings in history of  present illness.  Physical Exam  Blood pressure 104/66, pulse 96, temperature 98.1 F (36.7 C), temperature source Oral, resp. rate 18, SpO2 100.00%. GENERAL: Well-developed, well-nourished female in no acute distress.  HEENT: normocephalic HEART: normal rate RESP: normal effort ABDOMEN: Soft, non-tender, gravid appropriate for gestational age EXTREMITIES: Nontender, no edema NEURO: alert and oriented Pelvic exam: Cervix pink, visually 3-4 cm, moderate amount mucus-like discharge, no blood noted, vaginal walls and external genitalia normal  Dilation: 5 Effacement (%): 70 Cervical Position: Anterior Station: Ballotable Presentation: Vertex Exam by:: Sharen Counter CNM No blood on glove following exam  Cervix rechecked in 1 hour, 5cm/70/-3, fetal head well applied to cervix  Pt offered d/c or recheck in 1 hour related to minimal cervical change.  Pt opted to stay for reexam.  Cervix unchanged in 1 hour with fetal head ballotable on reexamination.  FHT:  Baseline 140 , moderate variability, accelerations present, no decelerations Contractions: q 3-8 mins, mild to moderate to palpation   Labs: Results for orders placed during the hospital encounter of 03/29/13 (from the past 24 hour(s))  WET PREP, GENITAL     Status:  Abnormal   Collection Time    03/29/13  3:00 PM      Result Value Ref Range   Yeast Wet Prep HPF POC NONE SEEN  NONE SEEN   Trich, Wet Prep NONE SEEN  NONE SEEN   Clue Cells Wet Prep HPF POC NONE SEEN  NONE SEEN   WBC, Wet Prep HPF POC MANY (*) NONE SEEN    Assessment: 1. Preterm contractions     Plan: Consult Dr Tamela OddiJackson-Moore Vicodin 5/325 x2 tabs given in MAU D/C home with labor precautions F/U tomorrow in office as scheduled Return to MAU as needed      Follow-up Information   Follow up with Antionette CharJACKSON-MOORE,LISA A, MD. (As scheduled tomorrow. Return to MAU as needed.)    Specialty:  Obstetrics and Gynecology   Contact information:   [address] [place] [address] [phone]        Medication List         diphenhydrAMINE 25 mg capsule  Commonly known as:  BENADRYL  Take 25 mg by mouth every 6 (six) hours as needed (for allergic reaction).        Sharen CounterLisa Leftwich-Kirby Certified Nurse-Midwife 03/29/2013 8:01 PM

## 2013-03-29 NOTE — MAU Note (Signed)
Pt states she has been cramping intermittently since Saturday, started bleeding today, denies placenta previa.

## 2013-03-29 NOTE — Discharge Instructions (Signed)

## 2013-03-30 ENCOUNTER — Ambulatory Visit (INDEPENDENT_AMBULATORY_CARE_PROVIDER_SITE_OTHER): Payer: Medicaid Other | Admitting: Obstetrics & Gynecology

## 2013-03-30 ENCOUNTER — Other Ambulatory Visit: Payer: Self-pay | Admitting: *Deleted

## 2013-03-30 VITALS — BP 138/84

## 2013-03-30 DIAGNOSIS — O479 False labor, unspecified: Secondary | ICD-10-CM

## 2013-03-30 DIAGNOSIS — O409XX0 Polyhydramnios, unspecified trimester, not applicable or unspecified: Secondary | ICD-10-CM

## 2013-03-30 DIAGNOSIS — O139 Gestational [pregnancy-induced] hypertension without significant proteinuria, unspecified trimester: Secondary | ICD-10-CM

## 2013-03-30 DIAGNOSIS — O4700 False labor before 37 completed weeks of gestation, unspecified trimester: Secondary | ICD-10-CM

## 2013-03-30 LAB — STREP B DNA PROBE: STREP GROUP B AG: NEGATIVE

## 2013-03-30 LAB — GLUCOSE, POCT (MANUAL RESULT ENTRY): POC GLUCOSE: 125 mg/dL — AB (ref 70–99)

## 2013-03-30 NOTE — Progress Notes (Signed)
Pt states that she is having thick watery yellow d/c.  Pt was seen in MAU on 03/29/13 and was told she was 5cm. Pt was sent home with pain medication and to f/u in office.  Borderline B/P elevation--PIH labs today.  CBGs in range.

## 2013-03-31 ENCOUNTER — Ambulatory Visit (HOSPITAL_COMMUNITY)
Admission: RE | Admit: 2013-03-31 | Discharge: 2013-03-31 | Disposition: A | Discharge status: Home / Self Care | Payer: Medicaid Other | Source: Ambulatory Visit | Attending: Obstetrics & Gynecology | Admitting: Obstetrics & Gynecology

## 2013-03-31 VITALS — BP 131/81 | HR 108 | Wt 178.0 lb

## 2013-03-31 DIAGNOSIS — Z3480 Encounter for supervision of other normal pregnancy, unspecified trimester: Secondary | ICD-10-CM

## 2013-03-31 DIAGNOSIS — O365990 Maternal care for other known or suspected poor fetal growth, unspecified trimester, not applicable or unspecified: Secondary | ICD-10-CM

## 2013-03-31 DIAGNOSIS — Z8751 Personal history of pre-term labor: Secondary | ICD-10-CM | POA: Insufficient documentation

## 2013-03-31 DIAGNOSIS — O479 False labor, unspecified: Secondary | ICD-10-CM

## 2013-03-31 DIAGNOSIS — O99810 Abnormal glucose complicating pregnancy: Secondary | ICD-10-CM | POA: Insufficient documentation

## 2013-03-31 DIAGNOSIS — O24419 Gestational diabetes mellitus in pregnancy, unspecified control: Secondary | ICD-10-CM

## 2013-03-31 DIAGNOSIS — O4700 False labor before 37 completed weeks of gestation, unspecified trimester: Secondary | ICD-10-CM

## 2013-03-31 DIAGNOSIS — O409XX0 Polyhydramnios, unspecified trimester, not applicable or unspecified: Secondary | ICD-10-CM | POA: Insufficient documentation

## 2013-03-31 DIAGNOSIS — E559 Vitamin D deficiency, unspecified: Secondary | ICD-10-CM

## 2013-03-31 LAB — CBC
HEMATOCRIT: 29.5 % — AB (ref 36.0–46.0)
Hemoglobin: 9.6 g/dL — ABNORMAL LOW (ref 12.0–15.0)
MCH: 22.7 pg — ABNORMAL LOW (ref 26.0–34.0)
MCHC: 32.5 g/dL (ref 30.0–36.0)
MCV: 69.7 fL — AB (ref 78.0–100.0)
PLATELETS: 256 10*3/uL (ref 150–400)
RBC: 4.23 MIL/uL (ref 3.87–5.11)
RDW: 17.9 % — AB (ref 11.5–15.5)
WBC: 10.9 10*3/uL — AB (ref 4.0–10.5)

## 2013-03-31 LAB — PROTEIN / CREATININE RATIO, URINE
Creatinine, Urine: 219.9 mg/dL
PROTEIN CREATININE RATIO: 0.06 (ref <0.15)
Total Protein, Urine: 13 mg/dL

## 2013-03-31 LAB — CREATININE, SERUM: Creat: 0.58 mg/dL (ref 0.50–1.10)

## 2013-03-31 LAB — ALT: ALT: <8 U/L (ref 0–35)

## 2013-03-31 LAB — LACTATE DEHYDROGENASE: LDH: 155 U/L (ref 94–250)

## 2013-03-31 LAB — PATHOLOGIST SMEAR REVIEW

## 2013-03-31 LAB — AST: AST: 10 U/L (ref 0–37)

## 2013-04-02 ENCOUNTER — Encounter: Payer: Self-pay | Admitting: Obstetrics & Gynecology

## 2013-04-02 DIAGNOSIS — D509 Iron deficiency anemia, unspecified: Secondary | ICD-10-CM | POA: Insufficient documentation

## 2013-04-02 NOTE — Patient Instructions (Signed)
Hypertension During Pregnancy Hypertension is also called high blood pressure. It can occur at any time in life and during pregnancy. When you have hypertension, there is extra pressure inside your blood vessels that carry blood from the heart to the rest of your body (arteries). Hypertension during pregnancy can cause problems for you and your baby. Your baby might not weigh as much as it should at birth or might be born early (premature). Very bad cases of hypertension during pregnancy can be life threatening.  Different types of hypertension can occur during pregnancy.   Chronic hypertension. This happens when a woman has hypertension before pregnancy and it continues during pregnancy.  Gestational hypertension. This is when hypertension develops during pregnancy.  Preeclampsia or toxemia of pregnancy. This is a very serious type of hypertension that develops only during pregnancy. It is a disease that affects the whole body (systemic) and can be very dangerous for both mother and baby.  Gestational hypertension and preeclampsia usually go away after your baby is born. Blood pressure generally stabilizes within 6 weeks. Women who have hypertension during pregnancy have a greater chance of developing hypertension later in life or with future pregnancies. RISK FACTORS Some factors make you more likely to develop hypertension during pregnancy. Risk factors include:  Having hypertension before pregnancy.  Having hypertension during a previous pregnancy.  Being overweight.  Being older than 40.  Being pregnant with more than one baby (multiples).  Having diabetes or kidney problems. SIGNS AND SYMPTOMS Chronic and gestational hypertension rarely cause symptoms. Preeclampsia has symptoms, which may include:  Increased protein in your urine. Your health care provider will check for this at every prenatal visit.  Swelling of your hands and face.  Rapid weight gain.  Headaches.  Visual  changes.  Being bothered by light.  Abdominal pain, especially in the right upper area.  Chest pain.  Shortness of breath.  Increased reflexes.  Seizures. Seizures occur with a more severe form of preeclampsia, called eclampsia. DIAGNOSIS   You may be diagnosed with hypertension during a regular prenatal exam. At each visit, tests may include:  Blood pressure checks.  A urine test to check for protein in your urine.  The type of hypertension you are diagnosed with depends on when you developed it. It also depends on your specific blood pressure reading.  Developing hypertension before 20 weeks of pregnancy is consistent with chronic hypertension.  Developing hypertension after 20 weeks of pregnancy is consistent with gestational hypertension.  Hypertension with increased urinary protein is diagnosed as preeclampsia.  Blood pressure measurements that stay above 160 systolic or 110 diastolic are a sign of severe preeclampsia. TREATMENT Treatment for hypertension during pregnancy varies. Treatment depends on the type of hypertension and how serious it is.  If you take medicine for chronic hypertension, you may need to switch medicines.  Drugs called ACE inhibitors should not be taken during pregnancy.  Low-dose aspirin may be suggested for women who have risk factors for preeclampsia.  If you have gestational hypertension, you may need to take a blood pressure medicine that is safe during pregnancy. Your health care provider will recommend the appropriate medicine.  If you have severe preeclampsia, you may need to be in the hospital. Health care providers will watch you and the baby very closely. You also may need to take medicine (magnesium sulfate) to prevent seizures and lower blood pressure.  Sometimes an early delivery is needed. This may be the case if the condition worsens. It would   be done to protect you and the baby. The only cure for preeclampsia is delivery. HOME  CARE INSTRUCTIONS  Schedule and keep all of your regular appointments for prenatal care.  Only take over-the-counter or prescription medicines as directed by your health care provider. Tell your health care provider about all medicines you take.  Eat as little salt as possible.  Get regular exercise.  Do not drink alcohol.  Do not use tobacco products.  Do not drink products with caffeine.  Lie on your left side when resting. SEEK IMMEDIATE MEDICAL CARE IF:  You have severe abdominal pain.  You have sudden swelling in the hands, ankles, or face.  You gain 4 pounds (1.8 kg) or more in 1 week.  You vomit repeatedly.  You have vaginal bleeding.  You do not feel the baby moving as much.  You have a headache.  You have blurred or double vision.  You have muscle twitching or spasms.  You have shortness of breath.  You have blue fingernails and lips.  You have blood in your urine. MAKE SURE YOU:  Understand these instructions.  Will watch your condition.  Will get help right away if you are not doing well or get worse. Document Released: 09/09/2010 Document Revised: 10/12/2012 Document Reviewed: 07/21/2012 [employer] Patient Information 2014 [employer].  

## 2013-04-03 ENCOUNTER — Encounter: Payer: Medicaid Other | Admitting: Obstetrics & Gynecology

## 2013-04-03 ENCOUNTER — Other Ambulatory Visit: Payer: Self-pay | Admitting: Obstetrics & Gynecology

## 2013-04-03 DIAGNOSIS — Z8751 Personal history of pre-term labor: Secondary | ICD-10-CM

## 2013-04-03 DIAGNOSIS — O99810 Abnormal glucose complicating pregnancy: Secondary | ICD-10-CM

## 2013-04-03 DIAGNOSIS — O409XX0 Polyhydramnios, unspecified trimester, not applicable or unspecified: Secondary | ICD-10-CM

## 2013-04-04 ENCOUNTER — Encounter: Payer: Medicaid Other | Admitting: Advanced Practice Midwife

## 2013-04-06 ENCOUNTER — Ambulatory Visit (HOSPITAL_COMMUNITY)
Admission: RE | Admit: 2013-04-06 | Discharge: 2013-04-06 | Disposition: A | Discharge status: Home / Self Care | Payer: Medicaid Other | Source: Ambulatory Visit | Attending: Obstetrics | Admitting: Obstetrics

## 2013-04-06 ENCOUNTER — Other Ambulatory Visit: Payer: Self-pay | Admitting: Obstetrics & Gynecology

## 2013-04-06 DIAGNOSIS — O409XX0 Polyhydramnios, unspecified trimester, not applicable or unspecified: Secondary | ICD-10-CM | POA: Insufficient documentation

## 2013-04-06 DIAGNOSIS — Z8751 Personal history of pre-term labor: Secondary | ICD-10-CM

## 2013-04-06 DIAGNOSIS — O99810 Abnormal glucose complicating pregnancy: Secondary | ICD-10-CM | POA: Insufficient documentation

## 2013-04-07 ENCOUNTER — Other Ambulatory Visit: Payer: Self-pay | Admitting: Obstetrics

## 2013-04-07 DIAGNOSIS — Z8751 Personal history of pre-term labor: Secondary | ICD-10-CM

## 2013-04-07 DIAGNOSIS — O99810 Abnormal glucose complicating pregnancy: Secondary | ICD-10-CM

## 2013-04-07 DIAGNOSIS — O409XX0 Polyhydramnios, unspecified trimester, not applicable or unspecified: Secondary | ICD-10-CM

## 2013-04-08 ENCOUNTER — Encounter: Payer: Self-pay | Admitting: Obstetrics & Gynecology

## 2013-04-11 ENCOUNTER — Inpatient Hospital Stay (HOSPITAL_COMMUNITY)
Admission: AD | Admit: 2013-04-11 | Discharge: 2013-04-13 | DRG: 775 | Disposition: A | Discharge status: Home / Self Care | Payer: Medicaid Other | Source: Ambulatory Visit | Attending: Obstetrics | Admitting: Obstetrics

## 2013-04-11 ENCOUNTER — Encounter: Payer: Medicaid Other | Admitting: Obstetrics

## 2013-04-11 ENCOUNTER — Encounter (HOSPITAL_COMMUNITY): Payer: Medicaid Other | Admitting: Anesthesiology

## 2013-04-11 ENCOUNTER — Encounter (HOSPITAL_COMMUNITY): Payer: Self-pay | Admitting: *Deleted

## 2013-04-11 ENCOUNTER — Inpatient Hospital Stay (HOSPITAL_COMMUNITY): Payer: Medicaid Other | Admitting: Anesthesiology

## 2013-04-11 DIAGNOSIS — D649 Anemia, unspecified: Secondary | ICD-10-CM | POA: Diagnosis not present

## 2013-04-11 DIAGNOSIS — O99814 Abnormal glucose complicating childbirth: Principal | ICD-10-CM | POA: Diagnosis present

## 2013-04-11 DIAGNOSIS — IMO0001 Reserved for inherently not codable concepts without codable children: Secondary | ICD-10-CM

## 2013-04-11 DIAGNOSIS — O9903 Anemia complicating the puerperium: Secondary | ICD-10-CM | POA: Diagnosis not present

## 2013-04-11 LAB — CBC
HCT: 30.6 % — ABNORMAL LOW (ref 36.0–46.0)
Hemoglobin: 9.8 g/dL — ABNORMAL LOW (ref 12.0–15.0)
MCH: 23 pg — ABNORMAL LOW (ref 26.0–34.0)
MCHC: 32 g/dL (ref 30.0–36.0)
MCV: 71.8 fL — ABNORMAL LOW (ref 78.0–100.0)
PLATELETS: 256 10*3/uL (ref 150–400)
RBC: 4.26 MIL/uL (ref 3.87–5.11)
RDW: 17.8 % — AB (ref 11.5–15.5)
WBC: 11 10*3/uL — ABNORMAL HIGH (ref 4.0–10.5)

## 2013-04-11 LAB — TYPE AND SCREEN
ABO/RH(D): B POS
Antibody Screen: NEGATIVE

## 2013-04-11 LAB — RPR: RPR Ser Ql: NONREACTIVE

## 2013-04-11 MED ORDER — OXYCODONE-ACETAMINOPHEN 5-325 MG PO TABS: 1.0000 | ORAL_TABLET | ORAL | Status: DC | PRN

## 2013-04-11 MED ORDER — EPHEDRINE 5 MG/ML INJ: 10.0000 mg | INTRAVENOUS | Status: DC | PRN

## 2013-04-11 MED ORDER — DIPHENHYDRAMINE HCL 25 MG PO CAPS
25.0000 mg | ORAL_CAPSULE | Freq: Four times a day (QID) | ORAL | Status: DC | PRN
  Administered 2013-04-12 (×3): 25 mg via ORAL
  Filled 2013-04-11 (×3): qty 1

## 2013-04-11 MED ORDER — PRENATAL MULTIVITAMIN CH
1.0000 | ORAL_TABLET | Freq: Every day | ORAL | Status: DC
  Administered 2013-04-12 – 2013-04-13 (×2): 1 via ORAL
  Filled 2013-04-11 (×2): qty 1

## 2013-04-11 MED ORDER — ONDANSETRON HCL 4 MG/2ML IJ SOLN: 4.0000 mg | INTRAMUSCULAR | Status: DC | PRN

## 2013-04-11 MED ORDER — FENTANYL 2.5 MCG/ML BUPIVACAINE 1/10 % EPIDURAL INFUSION (WH - ANES)
14.0000 mL/h | INTRAMUSCULAR | Status: DC | PRN
  Administered 2013-04-11: 14 mL/h via EPIDURAL
  Filled 2013-04-11: qty 125

## 2013-04-11 MED ORDER — TRAMADOL HCL 50 MG PO TABS
100.0000 mg | ORAL_TABLET | Freq: Four times a day (QID) | ORAL | Status: DC | PRN
  Administered 2013-04-11 – 2013-04-12 (×3): 100 mg via ORAL
  Filled 2013-04-11 (×3): qty 2

## 2013-04-11 MED ORDER — IBUPROFEN 600 MG PO TABS
600.0000 mg | ORAL_TABLET | Freq: Four times a day (QID) | ORAL | Status: DC
  Administered 2013-04-11 – 2013-04-13 (×9): 600 mg via ORAL
  Filled 2013-04-11 (×9): qty 1

## 2013-04-11 MED ORDER — PHENYLEPHRINE 40 MCG/ML (10ML) SYRINGE FOR IV PUSH (FOR BLOOD PRESSURE SUPPORT): 80.0000 ug | PREFILLED_SYRINGE | INTRAVENOUS | Status: DC | PRN

## 2013-04-11 MED ORDER — DIPHENHYDRAMINE HCL 50 MG/ML IJ SOLN
12.5000 mg | INTRAMUSCULAR | Status: DC | PRN
  Administered 2013-04-11: 12.5 mg via INTRAVENOUS

## 2013-04-11 MED ORDER — LIDOCAINE HCL (PF) 1 % IJ SOLN
30.0000 mL | INTRAMUSCULAR | Status: AC | PRN
  Administered 2013-04-11 (×2): 5 mL via SUBCUTANEOUS
  Filled 2013-04-11: qty 30

## 2013-04-11 MED ORDER — DIBUCAINE 1 % RE OINT
1.0000 "application " | TOPICAL_OINTMENT | RECTAL | Status: DC | PRN
  Filled 2013-04-11 (×2): qty 28

## 2013-04-11 MED ORDER — FENTANYL 2.5 MCG/ML BUPIVACAINE 1/10 % EPIDURAL INFUSION (WH - ANES): 14.0000 mL/h | INTRAMUSCULAR | Status: DC | PRN

## 2013-04-11 MED ORDER — BENZOCAINE-MENTHOL 20-0.5 % EX AERO
1.0000 "application " | INHALATION_SPRAY | CUTANEOUS | Status: DC | PRN
  Administered 2013-04-11: 1 via TOPICAL
  Filled 2013-04-11 (×2): qty 56

## 2013-04-11 MED ORDER — TETANUS-DIPHTH-ACELL PERTUSSIS 5-2.5-18.5 LF-MCG/0.5 IM SUSP
0.5000 mL | Freq: Once | INTRAMUSCULAR | Status: DC
  Filled 2013-04-11: qty 0.5

## 2013-04-11 MED ORDER — LACTATED RINGERS IV SOLN
500.0000 mL | Freq: Once | INTRAVENOUS | Status: AC
  Administered 2013-04-11: 1000 mL via INTRAVENOUS

## 2013-04-11 MED ORDER — IBUPROFEN 600 MG PO TABS: 600.0000 mg | ORAL_TABLET | Freq: Four times a day (QID) | ORAL | Status: DC | PRN

## 2013-04-11 MED ORDER — ZOLPIDEM TARTRATE 5 MG PO TABS: 5.0000 mg | ORAL_TABLET | Freq: Every evening | ORAL | Status: DC | PRN

## 2013-04-11 MED ORDER — ONDANSETRON HCL 4 MG PO TABS: 4.0000 mg | ORAL_TABLET | ORAL | Status: DC | PRN

## 2013-04-11 MED ORDER — OXYTOCIN 40 UNITS IN LACTATED RINGERS INFUSION - SIMPLE MED
62.5000 mL/h | INTRAVENOUS | Status: DC
  Administered 2013-04-11: 62.5 mL/h via INTRAVENOUS
  Filled 2013-04-11: qty 1000

## 2013-04-11 MED ORDER — PHENYLEPHRINE 40 MCG/ML (10ML) SYRINGE FOR IV PUSH (FOR BLOOD PRESSURE SUPPORT)
80.0000 ug | PREFILLED_SYRINGE | INTRAVENOUS | Status: DC | PRN
  Filled 2013-04-11: qty 10

## 2013-04-11 MED ORDER — LACTATED RINGERS IV SOLN: INTRAVENOUS | Status: DC

## 2013-04-11 MED ORDER — ONDANSETRON HCL 4 MG/2ML IJ SOLN
4.0000 mg | Freq: Four times a day (QID) | INTRAMUSCULAR | Status: DC | PRN
Start: 2013-04-11 — End: 2013-04-11

## 2013-04-11 MED ORDER — CITRIC ACID-SODIUM CITRATE 334-500 MG/5ML PO SOLN: 30.0000 mL | ORAL | Status: DC | PRN

## 2013-04-11 MED ORDER — EPHEDRINE 5 MG/ML INJ
10.0000 mg | INTRAVENOUS | Status: DC | PRN
  Filled 2013-04-11: qty 4

## 2013-04-11 MED ORDER — SENNOSIDES-DOCUSATE SODIUM 8.6-50 MG PO TABS
2.0000 | ORAL_TABLET | ORAL | Status: DC
  Administered 2013-04-11 – 2013-04-12 (×2): 2 via ORAL
  Filled 2013-04-11 (×2): qty 2

## 2013-04-11 MED ORDER — FLEET ENEMA 7-19 GM/118ML RE ENEM
1.0000 | ENEMA | RECTAL | Status: DC | PRN
Start: 2013-04-11 — End: 2013-04-11

## 2013-04-11 MED ORDER — WITCH HAZEL-GLYCERIN EX PADS
1.0000 "application " | MEDICATED_PAD | CUTANEOUS | Status: DC | PRN
  Administered 2013-04-12: 1 via TOPICAL

## 2013-04-11 MED ORDER — DIPHENHYDRAMINE HCL 50 MG/ML IJ SOLN
12.5000 mg | INTRAMUSCULAR | Status: DC | PRN
  Filled 2013-04-11: qty 1

## 2013-04-11 MED ORDER — LANOLIN HYDROUS EX OINT: TOPICAL_OINTMENT | CUTANEOUS | Status: DC | PRN

## 2013-04-11 MED ORDER — OXYTOCIN BOLUS FROM INFUSION: 500.0000 mL | INTRAVENOUS | Status: DC

## 2013-04-11 MED ORDER — SIMETHICONE 80 MG PO CHEW
80.0000 mg | CHEWABLE_TABLET | ORAL | Status: DC | PRN
Start: 2013-04-11 — End: 2013-04-13

## 2013-04-11 MED ORDER — ACETAMINOPHEN 325 MG PO TABS: 650.0000 mg | ORAL_TABLET | ORAL | Status: DC | PRN

## 2013-04-11 MED ORDER — LACTATED RINGERS IV SOLN: 500.0000 mL | INTRAVENOUS | Status: DC | PRN

## 2013-04-11 MED ORDER — LACTATED RINGERS IV SOLN: 500.0000 mL | Freq: Once | INTRAVENOUS | Status: DC

## 2013-04-11 NOTE — H&P (Signed)
Sara Park is a 23 y.o. female presenting for Labor, ROM. Maternal Medical History:  Reason for admission: Rupture of membranes, contractions and vaginal bleeding.   Contractions: Onset was 3-5 hours ago.   Frequency: regular.   Duration is approximately 50 seconds.   Perceived severity is moderate.    Fetal activity: Perceived fetal activity is normal.   Last perceived fetal movement was within the past hour.    Prenatal complications: Gestational DM, poor compliance  Prenatal Complications - Diabetes: gestational. Diabetes is managed by diet.      OB History   Grav Para Term Preterm Abortions TAB SAB Ect Mult Living   4 1 0 1 2 1 1   1      Past Medical History  Diagnosis Date  . H/O abdominal surgery   . Endometriosis   . Other and unspecified ovarian cysts   . UTI (lower urinary tract infection)   . Chlamydia   . Gestational diabetes    Past Surgical History  Procedure Laterality Date  . Tonsillectomy    . Abdominal exploration surgery  2013    to remove cysts & adhesions  . Adenoidectomy     Family History: family history includes Diabetes in her father and mother; Hypertension in her father and mother. Social History:  reports that she has never smoked. She has never used smokeless tobacco. She reports that she does not drink alcohol or use illicit drugs.   Prenatal Transfer Tool  Maternal Diabetes: Yes:  Diabetes Type:  Diet controlled, poor compliance Genetic Screening: Normal Maternal Ultrasounds/Referrals: Normal Fetal Ultrasounds or other Referrals:  Referred to Materal Fetal Medicine  Maternal Substance Abuse:  No Significant Maternal Medications:  None Significant Maternal Lab Results:  Lab values include: Other:  Other Comments:  Anemia, Preterm Labor  Review of Systems  Constitutional: Negative.   HENT: Negative.   Eyes: Negative.   Respiratory: Negative.   Cardiovascular: Negative.   Gastrointestinal: Positive for abdominal pain.   Genitourinary: Negative.   Musculoskeletal: Negative.   Skin: Negative.   Neurological: Negative.   Endo/Heme/Allergies: Negative.   Psychiatric/Behavioral: Negative.     Dilation: 7 Effacement (%): 90 Station: -2 Exam by:: Montez Morita, RN Blood pressure 127/79, pulse 109, temperature 98.4 F (36.9 C), temperature source Oral, resp. rate 18, height 5\' 1"  (1.549 m), weight 192 lb (87.091 kg), last menstrual period 07/26/2012, unknown if currently breastfeeding. Maternal Exam:  Uterine Assessment: Contraction strength is moderate.  Contraction duration is 50 seconds. Contraction frequency is regular.   Abdomen: Patient reports no abdominal tenderness. Fundal height is 37.   Estimated fetal weight is 3000.   Fetal presentation: vertex  Introitus: Normal vulva. Vagina is positive for vaginal discharge.  Ferning test: not done.  Nitrazine test: not done. Amniotic fluid character: clear.  Pelvis: adequate for delivery.      Fetal Exam Fetal Monitor Review: Mode: ultrasound.   Baseline rate: 140.  Variability: moderate (6-25 bpm).   Pattern: no decelerations.    Fetal State Assessment: Category I - tracings are normal.     Physical Exam  Constitutional: She is oriented to person, place, and time. She appears well-developed and well-nourished.  HENT:  Head: Normocephalic.  Neck: Normal range of motion.  Cardiovascular: Normal rate, regular rhythm and normal heart sounds.   Respiratory: Effort normal and breath sounds normal.  GI: Soft.  Genitourinary: Uterus normal. Vaginal discharge found.  ROM  Musculoskeletal: Normal range of motion.  Neurological: She is alert and oriented to  person, place, and time.  Skin: Skin is warm and dry.  Psychiatric: She has a normal mood and affect. Her behavior is normal. Judgment and thought content normal.    Prenatal labs: ABO, Rh: --/--/B POS (02/21 0753) Antibody: NEG (02/21 0753) Rubella: 1.28 (09/10 1149) RPR: NON REAC  (02/02 1340)  HBsAg: NEGATIVE (09/10 1149)  HIV: NON REACTIVE (02/02 1340)  GBS: NEGATIVE (03/24 1744)   Assessment/Plan: Admit to L&D Epidural at this time Anticipate NSVD Consult w/ MD Clearance CootsHarper PRN  Cat 1 FHR Normotensive, Afebrile Will obtain glucose.  Emera Bussie 04/11/2013, 11:02 AM

## 2013-04-11 NOTE — Anesthesia Postprocedure Evaluation (Signed)
Anesthesia Post Note  Patient: Sara Park  Procedure(s) Performed: * No procedures listed *  Anesthesia type: Epidural  Patient location: Mother/Baby  Post pain: Pain level controlled  Post assessment: Post-op Vital signs reviewed  Last Vitals:  Filed Vitals:   04/11/13 1600  BP: 114/76  Pulse: 85  Temp: 36.9 C  Resp: 18    Post vital signs: Reviewed  Level of consciousness:alert  Complications: No apparent anesthesia complications

## 2013-04-11 NOTE — Lactation Note (Signed)
This note was copied from the chart of Boy Shellee MiloZorah Goldberger. Lactation Consultation Note  Patient Name: Boy Shellee MiloZorah Appleby ZOXWR'UToday's Date: 04/11/2013 Reason for consult: Initial assessment;Late preterm infant at 37 weeks but weight over 6 pounds. Mom was unable to breastfeed her 694 yo daughter due to her being premature and unable to latch.  Mom did not pump for her but states that her new baby has latched to breast.  Mom chose to do STS after delivery but deferred breastfeeding until baby was 2 hours of age ("did not feel up to breastfeeding at that time").  Baby is 8 hours of age at time of LC visit and the family is visiting.  LC encouraged STS and cue feedings and informed Mom that LC is available tonight, if needed. LC encouraged review of Baby and Me pp 9, 14 and 20-25 for STS and BF information.  LC provided [employer] [facility] brochure and reviewed [facility] and list of community and web site resources.       Maternal Data Formula Feeding for Exclusion: No Infant to breast within first hour of birth: No Breastfeeding delayed due to:: Maternal status Has patient been taught Hand Expression?: Yes (mom states that she was shown how to hand express) Does the patient have breastfeeding experience prior to this delivery?: Yes  Feeding Feeding Type: Breast Fed  LATCH Score/Interventions            Initial LATCH score=8 per RN assessment          Lactation Tools Discussed/Used   STS, hand expression, cue feedings  Consult Status Consult Status: Follow-up Date: 04/12/13 Follow-up type: In-patient    Warrick ParisianBryant, Raygan Skarda [facility] 04/11/2013, 9:27 PM

## 2013-04-11 NOTE — Anesthesia Preprocedure Evaluation (Signed)
Anesthesia Evaluation  Patient identified by MRN, date of birth, ID band Patient awake    Reviewed: Allergy & Precautions, H&P , NPO status , Patient's Chart, lab work & pertinent test results  History of Anesthesia Complications Negative for: history of anesthetic complications  Airway Mallampati: II TM Distance: >3 FB Neck ROM: Full    Dental  (+) Teeth Intact   Pulmonary neg pulmonary ROS,  breath sounds clear to auscultation        Cardiovascular negative cardio ROS  Rhythm:Regular     Neuro/Psych negative neurological ROS  negative psych ROS   GI/Hepatic negative GI ROS, Neg liver ROS,   Endo/Other  negative endocrine ROS  Renal/GU negative Renal ROS     Musculoskeletal   Abdominal   Peds  Hematology  (+) anemia ,   Anesthesia Other Findings   Reproductive/Obstetrics                           Anesthesia Physical Anesthesia Plan  ASA: II  Anesthesia Plan: Epidural   Post-op Pain Management:    Induction:   Airway Management Planned: Natural Airway  Additional Equipment: None  Intra-op Plan:   Post-operative Plan:   Informed Consent: I have reviewed the patients History and Physical, chart, labs and discussed the procedure including the risks, benefits and alternatives for the proposed anesthesia with the patient or authorized representative who has indicated his/her understanding and acceptance.   Dental advisory given  Plan Discussed with:   Anesthesia Plan Comments:         Anesthesia Quick Evaluation

## 2013-04-11 NOTE — MAU Note (Signed)
C/o SROM @ 0815; c/o ucs since 0100;

## 2013-04-11 NOTE — Anesthesia Procedure Notes (Signed)
Epidural Patient location during procedure: OB Start time: 04/11/2013 10:51 AM End time: 04/11/2013 10:57 AM  Staffing Anesthesiologist: Lanier Felty, CHRIS Performed by: anesthesiologist   Preanesthetic Checklist Completed: patient identified, surgical consent, pre-op evaluation, timeout performed, IV checked, risks and benefits discussed and monitors and equipment checked  Epidural Patient position: sitting Prep: site prepped and draped and DuraPrep Patient monitoring: heart rate, cardiac monitor, continuous pulse ox and blood pressure Approach: midline Location: L3-L4 Injection technique: LOR saline  Needle:  Needle type: Tuohy  Needle gauge: 17 G Needle length: 9 cm Needle insertion depth: 6 cm Catheter type: closed end flexible Catheter size: 19 Gauge Catheter at skin depth: 12 cm Test dose: Other  Assessment Sensory level: T9 Events: blood not aspirated, injection not painful, no injection resistance, negative IV test and no paresthesia  Additional Notes H+P and labs checked, risks and benefits discussed with the patient, consent obtained, procedure tolerated well and without complications.  Reason for block:procedure for pain

## 2013-04-12 ENCOUNTER — Ambulatory Visit (HOSPITAL_COMMUNITY): Payer: Medicaid Other

## 2013-04-12 LAB — CBC
HCT: 27.6 % — ABNORMAL LOW (ref 36.0–46.0)
Hemoglobin: 8.5 g/dL — ABNORMAL LOW (ref 12.0–15.0)
MCH: 22.5 pg — ABNORMAL LOW (ref 26.0–34.0)
MCHC: 30.8 g/dL (ref 30.0–36.0)
MCV: 73 fL — ABNORMAL LOW (ref 78.0–100.0)
Platelets: 215 10*3/uL (ref 150–400)
RBC: 3.78 MIL/uL — ABNORMAL LOW (ref 3.87–5.11)
RDW: 18 % — ABNORMAL HIGH (ref 11.5–15.5)
WBC: 11.8 10*3/uL — ABNORMAL HIGH (ref 4.0–10.5)

## 2013-04-12 MED ORDER — ACETAMINOPHEN 500 MG PO TABS
1000.0000 mg | ORAL_TABLET | Freq: Four times a day (QID) | ORAL | Status: DC | PRN
  Administered 2013-04-12 – 2013-04-13 (×3): 1000 mg via ORAL
  Filled 2013-04-12 (×3): qty 2

## 2013-04-12 NOTE — Progress Notes (Signed)
Patient stating uncontrolled pain with Ibuprofen.  Patient unable to tolerate Ultram ordered, causes itching, and is unable to take Percocet due to allergy.  Notified Dr. Clearance CootsHarper and he ordered Tylenol 1000mg  q6 PRN.  Patient notified Brantley StageMarissa M Cox

## 2013-04-12 NOTE — Lactation Note (Signed)
This note was copied from the chart of Sara Shellee MiloZorah Newby. Lactation Consultation Note  Patient Name: Sara Park ZOXWR'UToday's Date: 04/12/2013 Reason for consult: Follow-up assessment of this mom and baby, now 33 hours postpartum.  Baby has been exclusively breastfeeding with most recent LATCH score of 9 and output wnl.  Mom denies any breastfeeding problems.  LC encouraged continued cue feedings and mom to call for breastfeeding help as needed.   Maternal Data    Feeding Feeding Type: Breast Fed Length of feed: 35 min  LATCH Score/Interventions Latch: Grasps breast easily, tongue down, lips flanged, rhythmical sucking.  Audible Swallowing: A few with stimulation  Type of Nipple: Everted at rest and after stimulation  Comfort (Breast/Nipple): Soft / non-tender     Hold (Positioning): No assistance needed to correctly position infant at breast. Intervention(s): Support Pillows  LATCH Score: 9 (most recent LATCH score, per RN)  Lactation Tools Discussed/Used   Cue feedings  Consult Status Consult Status: Follow-up Date: 04/13/13 Follow-up type: In-patient    Zara ChessJoanne P Rodrickus Min 04/12/2013, 10:15 PM

## 2013-04-12 NOTE — Progress Notes (Signed)
UR chart review completed.  

## 2013-04-13 MED ORDER — TRAMADOL HCL 50 MG PO TABS: 100.0000 mg | ORAL_TABLET | Freq: Four times a day (QID) | ORAL | Status: DC | PRN

## 2013-04-13 MED ORDER — FUSION PLUS PO CAPS: 1.0000 | ORAL_CAPSULE | Freq: Every day | ORAL | Status: DC

## 2013-04-13 MED ORDER — IBUPROFEN 600 MG PO TABS: 600.0000 mg | ORAL_TABLET | Freq: Four times a day (QID) | ORAL | Status: DC | PRN

## 2013-04-13 NOTE — Progress Notes (Signed)
Post Partum Day 2 Subjective: no complaints  Objective: Blood pressure 95/59, pulse 86, temperature 97.9 F (36.6 C), temperature source Oral, resp. rate 18, height 5\' 1"  (1.549 m), weight 192 lb (87.091 kg), SpO2 97.00%, unknown if currently breastfeeding.  Physical Exam:  General: alert and no distress Lochia: appropriate Uterine Fundus: firm Incision: none DVT Evaluation: No evidence of DVT seen on physical exam.   Recent Labs  04/11/13 0940 04/12/13 0605  HGB 9.8* 8.5*  HCT 30.6* 27.6*    Assessment/Plan: Discharge home.  Anemia.  Clinically stable.  Iron Rx.   LOS: 2 days   Sara Park 04/13/2013, 9:10 AM

## 2013-04-13 NOTE — Discharge Summary (Signed)
Obstetric Discharge Summary Reason for Admission: onset of labor Prenatal Procedures: ultrasound Intrapartum Procedures: spontaneous vaginal delivery Postpartum Procedures: none Complications-Operative and Postpartum: none Hemoglobin  Date Value Ref Range Status  04/12/2013 8.5* 12.0 - 15.0 g/dL Final     HCT  Date Value Ref Range Status  04/12/2013 27.6* 36.0 - 46.0 % Final    Physical Exam:  General: alert and no distress Lochia: appropriate Uterine Fundus: firm Incision: none DVT Evaluation: No evidence of DVT seen on physical exam.  Discharge Diagnoses: Term Pregnancy-delivered  Discharge Information: Date: 04/13/2013 Activity: pelvic rest Diet: routine Medications: PNV, Ibuprofen, Colace, Iron and Tranadol Condition: stable Instructions: refer to practice specific booklet Discharge to: home Follow-up Information   Follow up with Caelen Reierson A, MD In 2 weeks.   Specialty:  Obstetrics and Gynecology   Contact information:   [address] [place] [id number] [phone]       Newborn Data: Live born female  Birth Weight: 6 lb 15 oz (3147 g) APGAR: 8, 9  Home with mother.  Brock Badharles A Ekta Dancer 04/13/2013, 9:15 AM

## 2013-04-13 NOTE — Lactation Note (Signed)
This note was copied from the chart of Boy Shellee MiloZorah Goheen. Lactation Consultation Note  Patient Name: Boy Shellee MiloZorah Deamer NFAOZ'HToday's Date: 04/13/2013 Reason for consult: Follow-up assessment Follow-up appointment prior to discharge. Mom reports baby nursing well, but concerned that breasts are filling. Reviewed engorgement/prevention measures. Mom states she has a cold allergy, but used cabbage leaves with first child to avoid engorgement/mastitis when milk came in. Mom able to latch baby well, and baby softened mom's left breast effectively, mom relieved. Referred mom to Baby and Me book for EBM storage and number of diapers to expect. Mom given a hand pump with instructions and enc to call with any questions.  Maternal Data    Feeding Feeding Type: Breast Fed Length of feed: 15 min  LATCH Score/Interventions Latch: Grasps breast easily, tongue down, lips flanged, rhythmical sucking.  Audible Swallowing: Spontaneous and intermittent  Type of Nipple: Everted at rest and after stimulation  Comfort (Breast/Nipple): Filling, red/small blisters or bruises, mild/mod discomfort  Problem noted: Filling;Mild/Moderate discomfort  Hold (Positioning): No assistance needed to correctly position infant at breast. Intervention(s): Breastfeeding basics reviewed  LATCH Score: 9  Lactation Tools Discussed/Used     Consult Status Consult Status: Complete    Sherlyn HayJennifer D Mivaan Corbitt 04/13/2013, 10:20 AM

## 2013-04-13 NOTE — Anesthesia Postprocedure Evaluation (Deleted)
  Anesthesia Post-op Note  Patient: Sara Park  Procedure(s) Performed: * No procedures listed *  Patient Location: PACU and Mother/Baby  Anesthesia Type:Epidural  Level of Consciousness: awake, alert  and oriented  Airway and Oxygen Therapy: Patient Spontanous Breathing  Post-op Pain: none  Post-op Assessment: Post-op Vital signs reviewed, Patient's Cardiovascular Status Stable, Respiratory Function Stable, No signs of Nausea or vomiting and Pain level controlled  Post-op Vital Signs: Reviewed and stable  Last Vitals:  Filed Vitals:   04/13/13 0545  BP: 95/59  Pulse: 86  Temp: 36.6 C  Resp: 18    Complications: No apparent anesthesia complications

## 2013-04-19 ENCOUNTER — Ambulatory Visit (HOSPITAL_COMMUNITY): Payer: Medicaid Other

## 2013-04-25 ENCOUNTER — Ambulatory Visit: Payer: Medicaid Other | Admitting: Advanced Practice Midwife

## 2013-04-26 ENCOUNTER — Ambulatory Visit: Payer: Medicaid Other | Admitting: Obstetrics & Gynecology

## 2013-04-28 ENCOUNTER — Ambulatory Visit (INDEPENDENT_AMBULATORY_CARE_PROVIDER_SITE_OTHER): Payer: Medicaid Other | Admitting: Advanced Practice Midwife

## 2013-04-28 ENCOUNTER — Encounter: Payer: Self-pay | Admitting: Advanced Practice Midwife

## 2013-04-28 VITALS — BP 110/72 | HR 78 | Temp 98.1°F | Ht 61.0 in | Wt 162.0 lb

## 2013-04-28 DIAGNOSIS — D649 Anemia, unspecified: Secondary | ICD-10-CM

## 2013-04-28 DIAGNOSIS — K649 Unspecified hemorrhoids: Secondary | ICD-10-CM

## 2013-04-28 LAB — CBC
HEMATOCRIT: 35.8 % — AB (ref 36.0–46.0)
Hemoglobin: 11.7 g/dL — ABNORMAL LOW (ref 12.0–15.0)
MCH: 23 pg — ABNORMAL LOW (ref 26.0–34.0)
MCHC: 32.7 g/dL (ref 30.0–36.0)
MCV: 70.3 fL — AB (ref 78.0–100.0)
Platelets: 401 10*3/uL — ABNORMAL HIGH (ref 150–400)
RBC: 5.09 MIL/uL (ref 3.87–5.11)
RDW: 20.6 % — ABNORMAL HIGH (ref 11.5–15.5)
WBC: 6.9 10*3/uL (ref 4.0–10.5)

## 2013-04-28 MED ORDER — HYDROCORTISONE 2.5 % RE CREA: 1.0000 "application " | TOPICAL_CREAM | Freq: Two times a day (BID) | RECTAL | Status: DC

## 2013-04-28 NOTE — Progress Notes (Signed)
Post Partum Week #2 Subjective: Patient denies difficulty urinating or having bowel movements. Reports vaginal bleeding is normal. Denies depression. Reports hemorrhoid.  Patient reports pressure internally on right side w/ urination. Reports abnormal odor but denies vaginal itching or burning.  States the baby had the longest amount of sleep last night from 11-4. Prior to this he has been cluster feeding through the night.    Objective: Blood pressure 110/72, pulse 78, temperature 98.1 F (36.7 C), height 5\' 1"  (1.549 m), weight 162 lb (73.483 kg), currently breastfeeding.  Physical Exam:  General: alert and cooperative Lochia: appropriate Uterine Fundus: firm Incision: NA DVT Evaluation: No evidence of DVT seen on physical exam.  Edinburgh Scale:1 No results found for this basename: HGB, HCT,  in the last 72 hours  Assessment/Plan: Patient declines BCM at this time. Encouraged patient to avoid pregnancy for 1 year. Encouraged PNV. CBC today to evaluate Anemia, pending.  Reviewed methods to help w/ constipation. Will d/c iron if Hct is stable. LSIL w/ hx of abnormal pap smears. Patient to RTC in 4 weeks for colpo. Hemorrhoid cream BID.   Sara Park Dessa PhiHowell Tao Satz 04/28/2013, 4:37 PM

## 2013-05-26 ENCOUNTER — Encounter: Payer: Self-pay | Admitting: Advanced Practice Midwife

## 2013-05-26 ENCOUNTER — Ambulatory Visit: Payer: Medicaid Other | Admitting: Advanced Practice Midwife

## 2013-05-26 ENCOUNTER — Ambulatory Visit (INDEPENDENT_AMBULATORY_CARE_PROVIDER_SITE_OTHER): Payer: Medicaid Other | Admitting: Advanced Practice Midwife

## 2013-05-27 ENCOUNTER — Encounter: Payer: Self-pay | Admitting: Advanced Practice Midwife

## 2013-05-27 NOTE — Progress Notes (Signed)
Subjective:     Sara Park is a 23 y.o. female who presents for a postpartum visit. She is 6 weeks postpartum following a spontaneous vaginal delivery. I have fully reviewed the prenatal and intrapartum course. The delivery was at 37 gestational weeks. Outcome: spontaneous vaginal delivery. Anesthesia: epidural. Postpartum course has been uncomplicated. Baby's course has been uncomplicated. Baby is feeding by breast. Bleeding no bleeding. Bowel function is normal. Bladder function is normal. Patient is not sexually active. Contraception method is none. Postpartum depression screening: negative.  The following portions of the patient's history were reviewed and updated as appropriate: allergies, current medications, past family history, past medical history, past social history, past surgical history and problem list.  Review of Systems Constitutional: negative for fatigue and weight loss Respiratory: negative for cough and wheezing Cardiovascular: negative for chest pain, fatigue and palpitations Gastrointestinal: negative for abdominal pain and change in bowel habits Genitourinary:negative Integument/breast: negative for nipple discharge Musculoskeletal:negative for myalgias Neurological: negative for gait problems and tremors Behavioral/Psych: negative for abusive relationship, depression Endocrine: negative for temperature intolerance       Objective:    BP 127/83  Pulse 84  Temp(Src) 98.8 F (37.1 C)  Ht 5\' 1"  (1.549 m)  Wt 162 lb (73.483 kg)  BMI 30.63 kg/m2  LMP 05/15/2013  Breastfeeding? Yes  General:  alert and cooperative   Breasts:  inspection negative, no nipple discharge or bleeding, no masses or nodularity palpable  Lungs: clear to auscultation bilaterally  Heart:  regular rate and rhythm, S1, S2 normal, no murmur, click, rub or gallop  Abdomen: soft, non-tender; bowel sounds normal; no masses,  no organomegaly         Assessment:     6 week postpartum exam.  Pap smear not done at today's visit.  Patient Active Problem List   Diagnosis Date Noted  . Active labor 04/11/2013  . Vaginal delivery 04/11/2013  . Iron deficiency anemia 04/02/2013  . Pregnant 02/23/2013  . Preterm contractions 02/22/2013  . Gestational diabetes 02/12/2013  . Supervision of other normal pregnancy 09/22/2012  . LGSIL (low grade squamous intraepithelial lesion) on Pap smear 09/16/2012  . Unspecified vitamin D deficiency 09/16/2012     Plan:    1. Contraception: none 2. Patient needs colposcopy will reschedule this 3. Encouraged prevention of pregnancy x1 year. 4. RTC PRN 5. Reviewed methods to help w/ fussy baby  Leobardo Granlund Wilson Singer CNM

## 2013-07-12 ENCOUNTER — Encounter (HOSPITAL_COMMUNITY): Payer: Self-pay | Admitting: Emergency Medicine

## 2013-07-12 ENCOUNTER — Emergency Department (HOSPITAL_COMMUNITY)
Admission: EM | Admit: 2013-07-12 | Discharge: 2013-07-12 | Disposition: A | Discharge status: Home / Self Care | Payer: Medicaid Other | Attending: Emergency Medicine | Admitting: Emergency Medicine

## 2013-07-12 DIAGNOSIS — Z91040 Latex allergy status: Secondary | ICD-10-CM | POA: Insufficient documentation

## 2013-07-12 DIAGNOSIS — Z9889 Other specified postprocedural states: Secondary | ICD-10-CM | POA: Diagnosis not present

## 2013-07-12 DIAGNOSIS — N390 Urinary tract infection, site not specified: Secondary | ICD-10-CM | POA: Insufficient documentation

## 2013-07-12 DIAGNOSIS — Z8742 Personal history of other diseases of the female genital tract: Secondary | ICD-10-CM | POA: Diagnosis not present

## 2013-07-12 DIAGNOSIS — Z8619 Personal history of other infectious and parasitic diseases: Secondary | ICD-10-CM | POA: Diagnosis not present

## 2013-07-12 DIAGNOSIS — M7981 Nontraumatic hematoma of soft tissue: Secondary | ICD-10-CM | POA: Insufficient documentation

## 2013-07-12 DIAGNOSIS — Z79899 Other long term (current) drug therapy: Secondary | ICD-10-CM | POA: Diagnosis not present

## 2013-07-12 DIAGNOSIS — T148XXA Other injury of unspecified body region, initial encounter: Secondary | ICD-10-CM

## 2013-07-12 DIAGNOSIS — Z8632 Personal history of gestational diabetes: Secondary | ICD-10-CM | POA: Diagnosis not present

## 2013-07-12 DIAGNOSIS — M2550 Pain in unspecified joint: Secondary | ICD-10-CM

## 2013-07-12 LAB — CBC WITH DIFFERENTIAL/PLATELET
BASOS ABS: 0 10*3/uL (ref 0.0–0.1)
Basophils Relative: 0 % (ref 0–1)
EOS ABS: 0.1 10*3/uL (ref 0.0–0.7)
Eosinophils Relative: 1 % (ref 0–5)
HCT: 39.1 % (ref 36.0–46.0)
Hemoglobin: 12.5 g/dL (ref 12.0–15.0)
LYMPHS ABS: 1.8 10*3/uL (ref 0.7–4.0)
Lymphocytes Relative: 27 % (ref 12–46)
MCH: 25.2 pg — ABNORMAL LOW (ref 26.0–34.0)
MCHC: 32 g/dL (ref 30.0–36.0)
MCV: 78.7 fL (ref 78.0–100.0)
Monocytes Absolute: 0.4 10*3/uL (ref 0.1–1.0)
Monocytes Relative: 6 % (ref 3–12)
NEUTROS PCT: 66 % (ref 43–77)
Neutro Abs: 4.4 10*3/uL (ref 1.7–7.7)
PLATELETS: 377 10*3/uL (ref 150–400)
RBC: 4.97 MIL/uL (ref 3.87–5.11)
RDW: 15.8 % — ABNORMAL HIGH (ref 11.5–15.5)
WBC: 6.6 10*3/uL (ref 4.0–10.5)

## 2013-07-12 LAB — COMPREHENSIVE METABOLIC PANEL
ALT: 12 U/L (ref 0–35)
AST: 13 U/L (ref 0–37)
Albumin: 3.6 g/dL (ref 3.5–5.2)
Alkaline Phosphatase: 79 U/L (ref 39–117)
Anion gap: 15 (ref 5–15)
BUN: 10 mg/dL (ref 6–23)
CO2: 22 meq/L (ref 19–32)
CREATININE: 0.57 mg/dL (ref 0.50–1.10)
Calcium: 9.2 mg/dL (ref 8.4–10.5)
Chloride: 102 mEq/L (ref 96–112)
GLUCOSE: 92 mg/dL (ref 70–99)
Potassium: 4.1 mEq/L (ref 3.7–5.3)
Sodium: 139 mEq/L (ref 137–147)
Total Bilirubin: 0.2 mg/dL — ABNORMAL LOW (ref 0.3–1.2)
Total Protein: 7.3 g/dL (ref 6.0–8.3)

## 2013-07-12 LAB — URINE MICROSCOPIC-ADD ON

## 2013-07-12 LAB — URINALYSIS, ROUTINE W REFLEX MICROSCOPIC
Glucose, UA: NEGATIVE mg/dL
Ketones, ur: 15 mg/dL — AB
NITRITE: NEGATIVE
PROTEIN: NEGATIVE mg/dL
Specific Gravity, Urine: 1.031 — ABNORMAL HIGH (ref 1.005–1.030)
UROBILINOGEN UA: 1 mg/dL (ref 0.0–1.0)
pH: 5.5 (ref 5.0–8.0)

## 2013-07-12 MED ORDER — CEPHALEXIN 500 MG PO CAPS: 500.0000 mg | ORAL_CAPSULE | Freq: Four times a day (QID) | ORAL | Status: DC

## 2013-07-12 NOTE — ED Notes (Signed)
Pt alert x4 respirations easy non labored. Ambulates without assistance.

## 2013-07-12 NOTE — ED Provider Notes (Signed)
CSN: [id number]     Arrival date & time 07/12/13  1112 History   First MD Initiated Contact with Patient 07/12/13 1236     Chief Complaint  Patient presents with  . Joint Pain  . Bleeding/Bruising     (Consider location/radiation/quality/duration/timing/severity/associated sxs/prior Treatment) The history is provided by the patient.  pt 12 weeks postpartum, c/o in past 2-3 days noting a few areas bruising to bilateral legs. Denies trauma to areas. No hx same. Denies bruising over trunk, or other areas of body. No recent febrile illness. No fever or chills. No recent change in meds or new medication use. Denies asa use or recent nsaid use. No anticoag use.   States also had noticed mild soreness bilateral shoulders, wrists, knees.  No focal joint swelling or redness.  No hx arthritis or chronic joint or muscle pain. No recent wt loss or wt gain. Is eating/drinking per normal.      Past Medical History  Diagnosis Date  . H/O abdominal surgery   . Endometriosis   . Other and unspecified ovarian cysts   . UTI (lower urinary tract infection)   . Chlamydia   . Gestational diabetes    Past Surgical History  Procedure Laterality Date  . Tonsillectomy    . Abdominal exploration surgery  2013    to remove cysts & adhesions  . Adenoidectomy     Family History  Problem Relation Age of Onset  . Hypertension Mother   . Diabetes Mother   . Hypertension Father   . Diabetes Father    History  Substance Use Topics  . Smoking status: Never Smoker   . Smokeless tobacco: Never Used  . Alcohol Use: No   OB History   Grav Para Term Preterm Abortions TAB SAB Ect Mult Living   4 2 1 1 2 1 1   2      Review of Systems  Constitutional: Negative for fever and chills.  HENT: Negative for sore throat.   Eyes: Negative for pain and redness.  Respiratory: Negative for cough and shortness of breath.   Cardiovascular: Negative for chest pain and leg swelling.  Gastrointestinal: Negative for  vomiting, abdominal pain, diarrhea and blood in stool.  Genitourinary: Negative for dysuria, flank pain and vaginal bleeding.  Musculoskeletal: Positive for arthralgias. Negative for back pain and neck pain.  Skin: Negative for rash.  Neurological: Negative for weakness, numbness and headaches.  Hematological: Does not bruise/bleed easily.  Psychiatric/Behavioral: Negative for confusion.      Allergies  Doxycycline; Latex; and Percocet  Home Medications   Prior to Admission medications   Medication Sig Start Date End Date Taking? Authorizing Provider  IBUPROFEN PO Take 2 tablets by mouth at bedtime as needed (pain).   Yes Historical Provider, MD  PRESCRIPTION MEDICATION Apply 1 application topically once. Thrush cream.   Yes Historical Provider, MD   BP 109/69  Pulse 73  Temp(Src) 98 F (36.7 C)  Resp 20  Ht 5\' 1"  (1.549 m)  Wt 162 lb (73.483 kg)  BMI 30.63 kg/m2  SpO2 100% Physical Exam  Nursing note and vitals reviewed. Constitutional: She is oriented to person, place, and time. She appears well-developed and well-nourished. No distress.  HENT:  Mouth/Throat: Oropharynx is clear and moist.  Eyes: Conjunctivae are normal. Pupils are equal, round, and reactive to light. No scleral icterus.  Neck: Neck supple. No tracheal deviation present. No thyromegaly present.  Cardiovascular: Normal rate, regular rhythm, normal heart sounds and intact distal pulses.  Exam reveals no gallop and no friction rub.   No murmur heard. Pulmonary/Chest: Effort normal and breath sounds normal. No respiratory distress.  Abdominal: Soft. Normal appearance and bowel sounds are normal. She exhibits no distension and no mass. There is no tenderness. There is no rebound and no guarding.  Genitourinary:  No cva tenderness  Musculoskeletal: She exhibits no edema and no tenderness.  Good rom bil ext, no focal joint swelling, erythema or tenderness noted. Distal pulses palp bil  Lymphadenopathy:    She  has no cervical adenopathy.  Neurological: She is alert and oriented to person, place, and time.  Skin: Skin is warm and dry. No rash noted. She is not diaphoretic.  Few small bruises, a couple cm diameter,  noted a couple on right leg, another on left. No petechia or purpuric lesions. No other rash noted.   Psychiatric: She has a normal mood and affect.    ED Course  Procedures (including critical care time) Labs Review  Results for orders placed during the hospital encounter of 07/12/13  COMPREHENSIVE METABOLIC PANEL      Result Value Ref Range   Sodium 139  137 - 147 mEq/L   Potassium 4.1  3.7 - 5.3 mEq/L   Chloride 102  96 - 112 mEq/L   CO2 22  19 - 32 mEq/L   Glucose, Bld 92  70 - 99 mg/dL   BUN 10  6 - 23 mg/dL   Creatinine, Ser 0.980.57  0.50 - 1.10 mg/dL   Calcium 9.2  8.4 - 11.910.5 mg/dL   Total Protein 7.3  6.0 - 8.3 g/dL   Albumin 3.6  3.5 - 5.2 g/dL   AST 13  0 - 37 U/L   ALT 12  0 - 35 U/L   Alkaline Phosphatase 79  39 - 117 U/L   Total Bilirubin 0.2 (*) 0.3 - 1.2 mg/dL   GFR calc non Af Amer >90  >90 mL/min   GFR calc Af Amer >90  >90 mL/min   Anion gap 15  5 - 15  CBC WITH DIFFERENTIAL      Result Value Ref Range   WBC 6.6  4.0 - 10.5 K/uL   RBC 4.97  3.87 - 5.11 MIL/uL   Hemoglobin 12.5  12.0 - 15.0 g/dL   HCT 14.739.1  82.936.0 - 56.246.0 %   MCV 78.7  78.0 - 100.0 fL   MCH 25.2 (*) 26.0 - 34.0 pg   MCHC 32.0  30.0 - 36.0 g/dL   RDW 13.015.8 (*) 86.511.5 - 78.415.5 %   Platelets 377  150 - 400 K/uL   Neutrophils Relative % 66  43 - 77 %   Neutro Abs 4.4  1.7 - 7.7 K/uL   Lymphocytes Relative 27  12 - 46 %   Lymphs Abs 1.8  0.7 - 4.0 K/uL   Monocytes Relative 6  3 - 12 %   Monocytes Absolute 0.4  0.1 - 1.0 K/uL   Eosinophils Relative 1  0 - 5 %   Eosinophils Absolute 0.1  0.0 - 0.7 K/uL   Basophils Relative 0  0 - 1 %   Basophils Absolute 0.0  0.0 - 0.1 K/uL  URINALYSIS, ROUTINE W REFLEX MICROSCOPIC      Result Value Ref Range   Color, Urine YELLOW  YELLOW   APPearance TURBID  (*) CLEAR   Specific Gravity, Urine 1.031 (*) 1.005 - 1.030   pH 5.5  5.0 - 8.0   Glucose,  UA NEGATIVE  NEGATIVE mg/dL   Hgb urine dipstick SMALL (*) NEGATIVE   Bilirubin Urine SMALL (*) NEGATIVE   Ketones, ur 15 (*) NEGATIVE mg/dL   Protein, ur NEGATIVE  NEGATIVE mg/dL   Urobilinogen, UA 1.0  0.0 - 1.0 mg/dL   Nitrite NEGATIVE  NEGATIVE   Leukocytes, UA LARGE (*) NEGATIVE  URINE MICROSCOPIC-ADD ON      Result Value Ref Range   Squamous Epithelial / LPF MANY (*) RARE   WBC, UA TOO NUMEROUS TO COUNT  <3 WBC/hpf   RBC / HPF 3-6  <3 RBC/hpf   Bacteria, UA MANY (*) RARE      MDM  Labs.  Reviewed nursing notes and prior charts for additional history.   Blood work normal - discussed w pt - given bruising only on legs/in areas where could have had mild contusion, feel no further emergent ED workup indicated at this point - will refer to pcp f/u.  ua c/w uti, cx sent. rx keflex.   Pt vitals normal, appears stable for d/c.    Suzi Roots, MD 07/12/13 [phone]

## 2013-07-12 NOTE — Discharge Instructions (Signed)
Regarding bruising, your blood work appears unremarkable.  Avoid aspirin and/or non steroidal anti-inflammatory medication.  Follow up with primary care doctor in the next couple weeks.  The urine tests show a urine infection -  A urine culture was sent the results of which will be back in 2-3 days.  Take antibiotic (keflex) as prescribed. Drink plenty of fluids. Return to ER if worse, new symptoms, fevers, vomiting, severe bruising and/or abnormal bleeding, other concern.     Urinary Tract Infection Urinary tract infections (UTIs) can develop anywhere along your urinary tract. Your urinary tract is your body's drainage system for removing wastes and extra water. Your urinary tract includes two kidneys, two ureters, a bladder, and a urethra. Your kidneys are a pair of bean-shaped organs. Each kidney is about the size of your fist. They are located below your ribs, one on each side of your spine. CAUSES Infections are caused by microbes, which are microscopic organisms, including fungi, viruses, and bacteria. These organisms are so small that they can only be seen through a microscope. Bacteria are the microbes that most commonly cause UTIs. SYMPTOMS  Symptoms of UTIs may vary by age and gender of the patient and by the location of the infection. Symptoms in young women typically include a frequent and intense urge to urinate and a painful, burning feeling in the bladder or urethra during urination. Older women and men are more likely to be tired, shaky, and weak and have muscle aches and abdominal pain. A fever may mean the infection is in your kidneys. Other symptoms of a kidney infection include pain in your back or sides below the ribs, nausea, and vomiting. DIAGNOSIS To diagnose a UTI, your caregiver will ask you about your symptoms. Your caregiver also will ask to provide a urine sample. The urine sample will be tested for bacteria and white blood cells. White blood cells are made by your body to  help fight infection. TREATMENT  Typically, UTIs can be treated with medication. Because most UTIs are caused by a bacterial infection, they usually can be treated with the use of antibiotics. The choice of antibiotic and length of treatment depend on your symptoms and the type of bacteria causing your infection. HOME CARE INSTRUCTIONS  If you were prescribed antibiotics, take them exactly as your caregiver instructs you. Finish the medication even if you feel better after you have only taken some of the medication.  Drink enough water and fluids to keep your urine clear or pale yellow.  Avoid caffeine, tea, and carbonated beverages. They tend to irritate your bladder.  Empty your bladder often. Avoid holding urine for long periods of time.  Empty your bladder before and after sexual intercourse.  After a bowel movement, women should cleanse from front to back. Use each tissue only once. SEEK MEDICAL CARE IF:   You have back pain.  You develop a fever.  Your symptoms do not begin to resolve within 3 days. SEEK IMMEDIATE MEDICAL CARE IF:   You have severe back pain or lower abdominal pain.  You develop chills.  You have nausea or vomiting.  You have continued burning or discomfort with urination. MAKE SURE YOU:   Understand these instructions.  Will watch your condition.  Will get help right away if you are not doing well or get worse. Document Released: 10/01/2004 Document Revised: 06/23/2011 Document Reviewed: 01/30/2011 [facility] Patient Information 2015 [place], [place]. This information is not intended to replace advice given to you by your health  care provider. Make sure you discuss any questions you have with your health care provider.     Arthralgia Arthralgia is joint pain. A joint is a place where two bones meet. Joint pain can happen for many reasons. The joint can be bruised, stiff, infected, or weak from aging. Pain usually goes away after resting and taking  medicine for soreness.  HOME CARE  Rest the joint as told by your doctor.  Keep the sore joint raised (elevated) for the first 24 hours.  Put ice on the joint area.  Put ice in a plastic bag.  Place a towel between your skin and the bag.  Leave the ice on for 15-20 minutes, 03-04 times a day.  Wear your splint, casting, elastic bandage, or sling as told by your doctor.  Only take medicine as told by your doctor. Do not take aspirin.  Use crutches as told by your doctor. Do not put weight on the joint until told to by your doctor. GET HELP RIGHT AWAY IF:   You have bruising, puffiness (swelling), or more pain.  Your fingers or toes turn blue or start to lose feeling (numb).  Your medicine does not lessen the pain.  Your pain becomes severe.  You have a temperature by mouth above 102 F (38.9 C), not controlled by medicine.  You cannot move or use the joint. MAKE SURE YOU:   Understand these instructions.  Will watch your condition.  Will get help right away if you are not doing well or get worse. Document Released: 12/10/2008 Document Revised: 03/16/2011 Document Reviewed: 12/10/2008 [facility]ExitCare Patient Information 2015 [place], [place]. This information is not intended to replace advice given to you by your health care provider. Make sure you discuss any questions you have with your health care provider.

## 2013-07-12 NOTE — ED Notes (Signed)
Pt has 7212 week old baby that she is currently breastfeeding and young child in room with pt. No other adult available to care for children.

## 2013-07-12 NOTE — ED Notes (Signed)
Pt reports generalized joint pain to entire body and random bruises x several days. No acute distress noted at triage.

## 2013-07-13 LAB — URINE CULTURE: Colony Count: >=100000

## 2013-07-27 ENCOUNTER — Encounter (HOSPITAL_COMMUNITY): Payer: Self-pay | Admitting: *Deleted

## 2013-07-27 ENCOUNTER — Inpatient Hospital Stay (HOSPITAL_COMMUNITY)
Admission: AD | Admit: 2013-07-27 | Discharge: 2013-07-27 | Disposition: A | Discharge status: Home / Self Care | Payer: Medicaid Other | Source: Ambulatory Visit | Attending: Obstetrics | Admitting: Obstetrics

## 2013-07-27 DIAGNOSIS — Z3202 Encounter for pregnancy test, result negative: Secondary | ICD-10-CM | POA: Diagnosis not present

## 2013-07-27 DIAGNOSIS — Z833 Family history of diabetes mellitus: Secondary | ICD-10-CM | POA: Insufficient documentation

## 2013-07-27 DIAGNOSIS — Z8249 Family history of ischemic heart disease and other diseases of the circulatory system: Secondary | ICD-10-CM | POA: Diagnosis not present

## 2013-07-27 DIAGNOSIS — R109 Unspecified abdominal pain: Secondary | ICD-10-CM | POA: Insufficient documentation

## 2013-07-27 LAB — POCT PREGNANCY, URINE: PREG TEST UR: NEGATIVE

## 2013-07-27 LAB — URINALYSIS, ROUTINE W REFLEX MICROSCOPIC
Bilirubin Urine: NEGATIVE
Glucose, UA: NEGATIVE mg/dL
Hgb urine dipstick: NEGATIVE
Ketones, ur: NEGATIVE mg/dL
Nitrite: NEGATIVE
PROTEIN: NEGATIVE mg/dL
Specific Gravity, Urine: 1.01 (ref 1.005–1.030)
Urobilinogen, UA: 1 mg/dL (ref 0.0–1.0)
pH: 7.5 (ref 5.0–8.0)

## 2013-07-27 LAB — URINE MICROSCOPIC-ADD ON

## 2013-07-27 LAB — HCG, QUANTITATIVE, PREGNANCY: hCG, Beta Chain, Quant, S: <1 m[IU]/mL (ref <5)

## 2013-07-27 NOTE — MAU Provider Note (Signed)
History     CSN: [id number]  Arrival date and time: 07/27/13 1135   First Provider Initiated Contact with Patient 07/27/13 1244      Chief Complaint  Patient presents with  . Possible Pregnancy  . Abdominal Pain   Possible Pregnancy Associated symptoms include abdominal pain (cramping) and nausea. Pertinent negatives include no vomiting.  Abdominal Pain Associated symptoms include nausea. Pertinent negatives include no vomiting.    23 yo [id number] here with report of concern of pregnancy.  NSVD on 04/11/13. +exclusive breastfeeding.  +faint line on pregnancy test last night.  Slight cramping.    Past Medical History  Diagnosis Date  . H/O abdominal surgery   . Endometriosis   . Other and unspecified ovarian cysts   . UTI (lower urinary tract infection)   . Chlamydia   . Gestational diabetes     Past Surgical History  Procedure Laterality Date  . Tonsillectomy    . Abdominal exploration surgery  2013    to remove cysts & adhesions  . Adenoidectomy      Family History  Problem Relation Age of Onset  . Hypertension Mother   . Diabetes Mother   . Hypertension Father   . Diabetes Father     History  Substance Use Topics  . Smoking status: Never Smoker   . Smokeless tobacco: Never Used  . Alcohol Use: No    Allergies:  Allergies  Allergen Reactions  . Doxycycline Other (See Comments)    Causes abdominal muscle spasms  . Latex Rash  . Percocet [Oxycodone-Acetaminophen] Rash    Per pt, tolerates vicodin    Prescriptions prior to admission  Medication Sig Dispense Refill  . cephALEXin (KEFLEX) 500 MG capsule Take 1 capsule (500 mg total) by mouth 4 (four) times daily.  20 capsule  0  . IBUPROFEN PO Take 2 tablets by mouth at bedtime as needed (pain).      Marland Kitchen. PRESCRIPTION MEDICATION Apply 1 application topically once. Thrush cream.        Review of Systems  Gastrointestinal: Positive for nausea and abdominal pain (cramping). Negative for vomiting.  All  other systems reviewed and are negative.  Physical Exam   Blood pressure 132/88, pulse 101, temperature 99.3 F (37.4 C), temperature source Oral, resp. rate 12, height 5' 2.5" (1.588 m), weight 71.215 kg (157 lb), SpO2 100.00%, currently breastfeeding.  Physical Exam  Constitutional: She is oriented to person, place, and time. She appears well-developed and well-nourished.  HENT:  Head: Normocephalic.  Neck: Normal range of motion. Neck supple.  Cardiovascular: Normal rate, regular rhythm and normal heart sounds.   Respiratory: Effort normal and breath sounds normal.  Genitourinary: No bleeding around the vagina.  Neurological: She is alert and oriented to person, place, and time.  Skin: Skin is warm and dry.    MAU Course  Procedures  Results for orders placed during the hospital encounter of 07/27/13 (from the past 24 hour(s))  URINALYSIS, ROUTINE W REFLEX MICROSCOPIC     Status: Abnormal   Collection Time    07/27/13 12:15 PM      Result Value Ref Range   Color, Urine YELLOW  YELLOW   APPearance CLEAR  CLEAR   Specific Gravity, Urine 1.010  1.005 - 1.030   pH 7.5  5.0 - 8.0   Glucose, UA NEGATIVE  NEGATIVE mg/dL   Hgb urine dipstick NEGATIVE  NEGATIVE   Bilirubin Urine NEGATIVE  NEGATIVE   Ketones, ur NEGATIVE  NEGATIVE mg/dL  Protein, ur NEGATIVE  NEGATIVE mg/dL   Urobilinogen, UA 1.0  0.0 - 1.0 mg/dL   Nitrite NEGATIVE  NEGATIVE   Leukocytes, UA SMALL (*) NEGATIVE  URINE MICROSCOPIC-ADD ON     Status: Abnormal   Collection Time    07/27/13 12:15 PM      Result Value Ref Range   Squamous Epithelial / LPF MANY (*) RARE   WBC, UA 3-6  <3 WBC/hpf  POCT PREGNANCY, URINE     Status: None   Collection Time    07/27/13 12:24 PM      Result Value Ref Range   Preg Test, Ur NEGATIVE  NEGATIVE    Assessment and Plan  Pregnancy Test  Plan:   BHCG machine not working; pt opts to get called with results Discharge to home  Elenora Fender [facility] N 07/27/2013, 12:45 PM    Addendum: Pt called and notified that BHCG <1.  Pt plans to follow-up with [facility] for family planning.

## 2013-07-27 NOTE — Discharge Instructions (Signed)
Human Chorionic Gonadotropin (hCG) °This is a test to confirm and monitor pregnancy or to diagnose trophoblastic disease or germ cell tumors. °As early as 10 days after a missed menstrual period (some methods can detect hCG even earlier, at one week after conception) or if your caregiver thinks that your symptoms suggest ectopic pregnancy, a failing pregnancy, trophoblastic disease, or germ cell tumors. hCG is a protein produced in the placenta of a pregnant woman. A pregnancy test is a specific blood or urine test that can detect hCG and confirm pregnancy. This hormone is able to be detected 10 days after a missed menstrual period, the time period when the fertilized egg is implanted in the woman's uterus. With some methods, hCG can be detected even earlier, at one week after conception.  °During the early weeks of pregnancy, hCG is important in maintaining function of the corpus luteum (the mass of cells that forms from a mature egg). Production of hCG increases steadily during the first trimester (8-10 weeks), peaking around the 10th week after the last menstrual cycle. Levels then fall slowly during the remainder of the pregnancy. hCG is no longer detectable within a few weeks after delivery. hCG is also produced by some germ cell tumors and increased levels are seen in trophoblastic disease. °SAMPLE COLLECTION °hCG is commonly detected in urine. The preferred specimen is a random urine sample collected first thing in the morning. hCG can also be measured in blood drawn from a vein in the arm. °NORMAL FINDINGS °Qualitative: negative in non-pregnant women; positive in pregnancy °Quantitative:  °· Gestation less than 1 week: 5-50 Whole HCG (milli-international units/mL) °· Gestation of 2 weeks: 50-500 Whole HCG (milli-international units/mL) °· Gestation of 3 weeks: 100-10,000 Whole HCG (milli-international units/mL) °· Gestation of 4 weeks: 1,000-30,000 Whole HCG (milli-international units/mL) °· Gestation of 5  weeks 3,500-115,000 Whole HCG (milli-international units/mL) °· Gestation of 6-8 weeks: 12,000-270,000 Whole HCG (milli-international units/mL) °· Gestation of 12 weeks: 15,000-220,000 Whole HCG (milli-international units/mL) °· Males and non-pregnant females: less than 5 Whole HCG (milli-international units/mL) °Beta subunit: depends on the method and test used °Ranges for normal findings may vary among different laboratories and hospitals. You should always check with your doctor after having lab work or other tests done to discuss the meaning of your test results and whether your values are considered within normal limits. °MEANING OF TEST  °Your caregiver will go over the test results with you and discuss the importance and meaning of your results, as well as treatment options and the need for additional tests if necessary. °OBTAINING THE TEST RESULTS °It is your responsibility to obtain your test results. Ask the lab or department performing the test when and how you will get your results. °Document Released: 01/24/2004 Document Revised: 03/16/2011 Document Reviewed: 03/27/2013 °ExitCare® Patient Information ©2015 Exit[employer], LLC. This information is not intended to replace advice given to you by your health care provider. Make sure you discuss any questions you have with your health care provider. ° °

## 2013-07-27 NOTE — MAU Note (Signed)
PT SAYS

## 2013-07-27 NOTE — MAU Note (Signed)
PT SAYS SHE DEL VAG ON 04-11-2013-   BY  CNM   BREASTFEEDING.    NO CYCLE- SINCE DEL-  DID  HPT- LAST NIGHT -  HAD VERY FAINT LINE.    NO BIRTH CONTROL..   SHE HAS DULL ACHE IN PELVIC AREA- STARTED LAST Thursday.  NO PAIN MED. WAS IN OFFICE  MAY 7-  .   NO OTHER APPOINTMENT.  NO VAG BLEEDING.    LAST SEX- SAT.

## 2013-07-27 NOTE — MAU Note (Signed)
Patient states she has had a positive home pregnancy test last night. Has had lower abdominal pain for about one week. Has been breast feeding a 204 month old baby and has not had a period. Denies bleeding but has a clear vaginal discharge. Nausea, no vomiting, hot flashes.

## 2013-11-06 ENCOUNTER — Encounter (HOSPITAL_COMMUNITY): Payer: Self-pay | Admitting: *Deleted

## 2014-01-01 ENCOUNTER — Encounter: Payer: Self-pay | Admitting: *Deleted

## 2014-01-02 ENCOUNTER — Encounter: Payer: Self-pay | Admitting: Obstetrics & Gynecology

## 2014-11-20 IMAGING — US US OB COMP LESS 14 WK
1 series · 14 of 28 positions shown · non-contrast
Comparison: None

CLINICAL DATA: Pelvic and left lower quadrant pain, pregnant,
quantitative beta HCG 1511

OBSTETRIC <14 WK US AND TRANSVAGINAL OB US
TECHNIQUE: Both transabdominal and transvaginal ultrasound
examinations were performed for complete evaluation of the
gestation as well as the maternal uterus, adnexal regions, and
pelvic cul-de-sac.  Transvaginal technique was performed to assess
early pregnancy.

[Series 1: us ob comp less 14 wks · 14 of 44 slices shown]
[im 2/44]
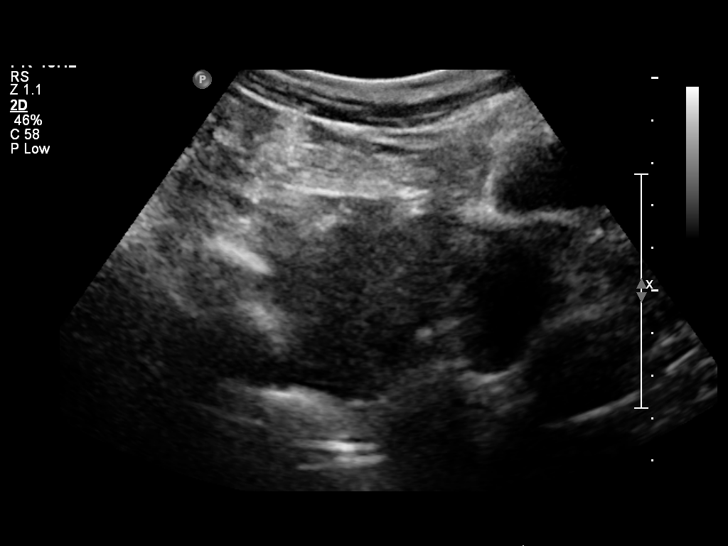
[im 5/44]
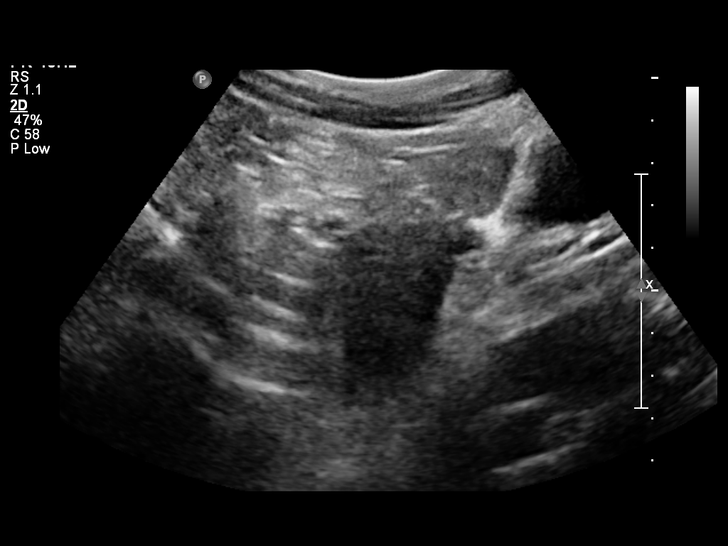
[im 8/44]
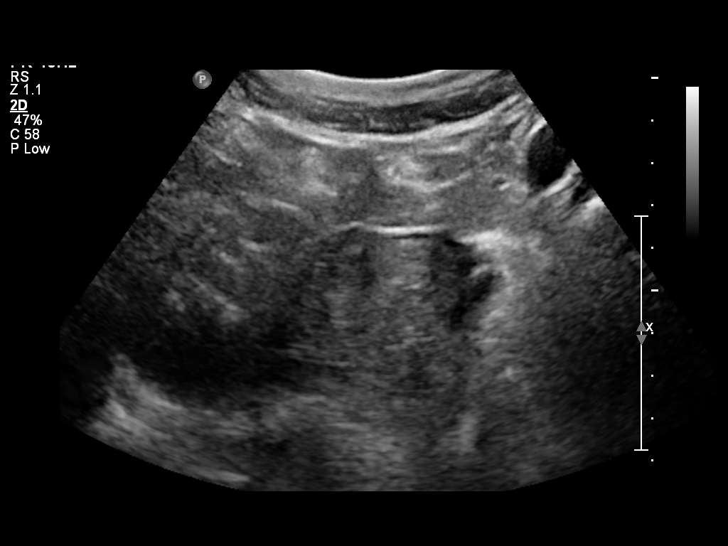
[im 12/44]
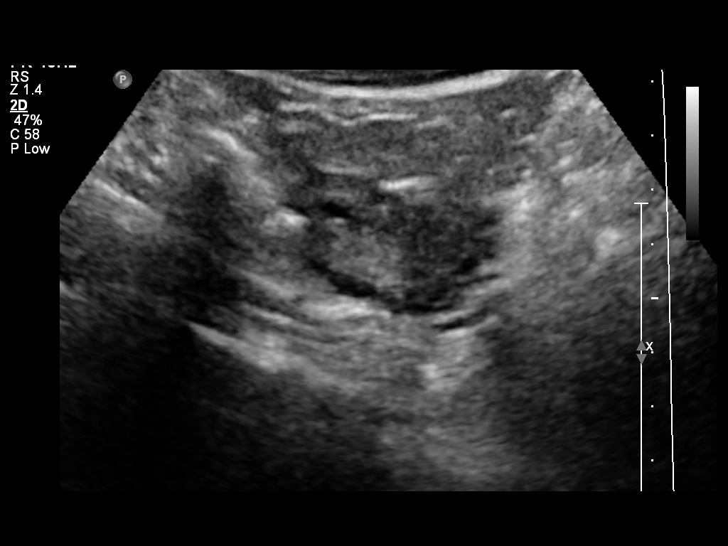
[im 15/44]
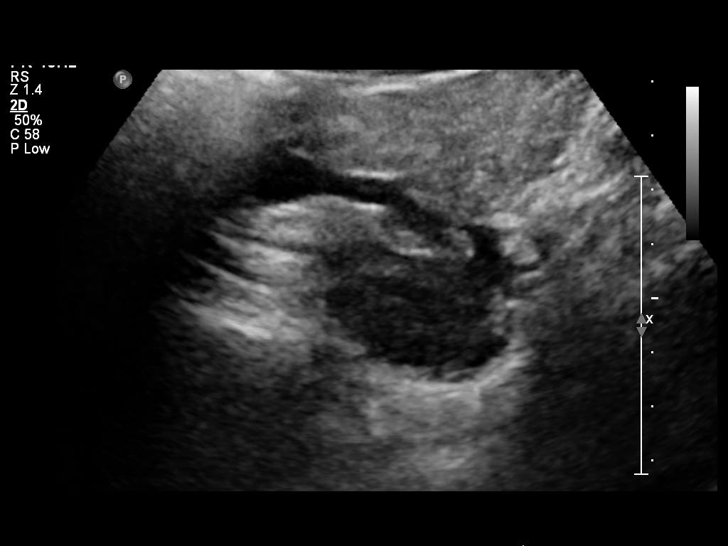
[im 18/44]
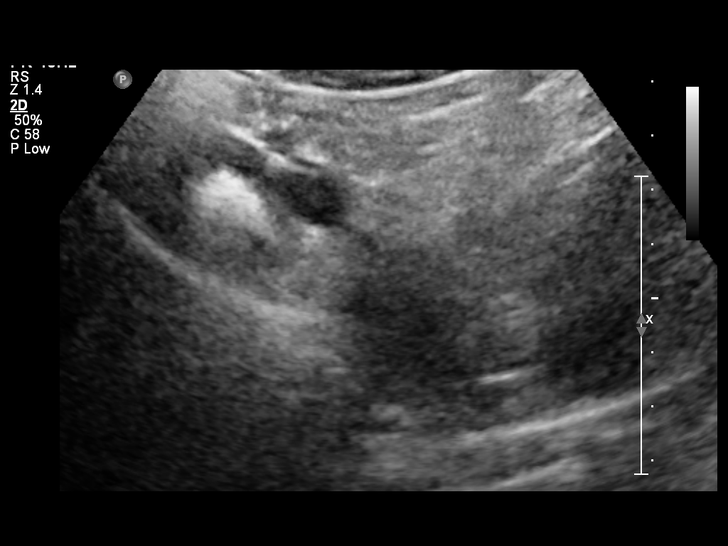
[im 21/44]
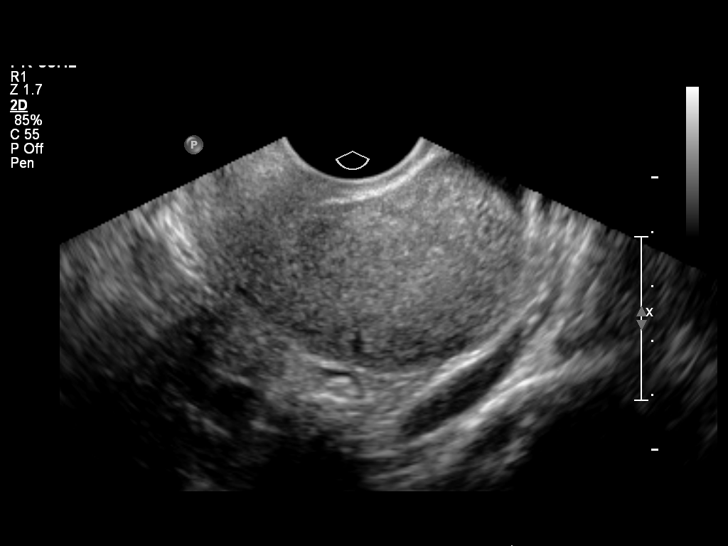
[im 24/44]
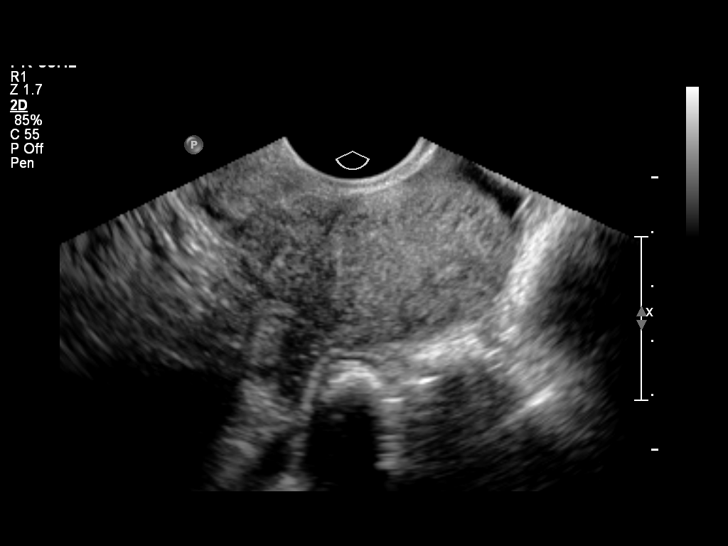
[im 28/44]
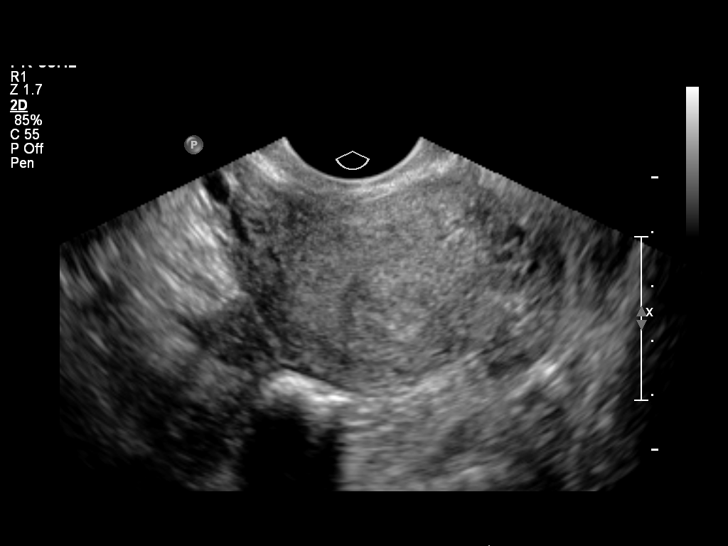
[im 31/44]
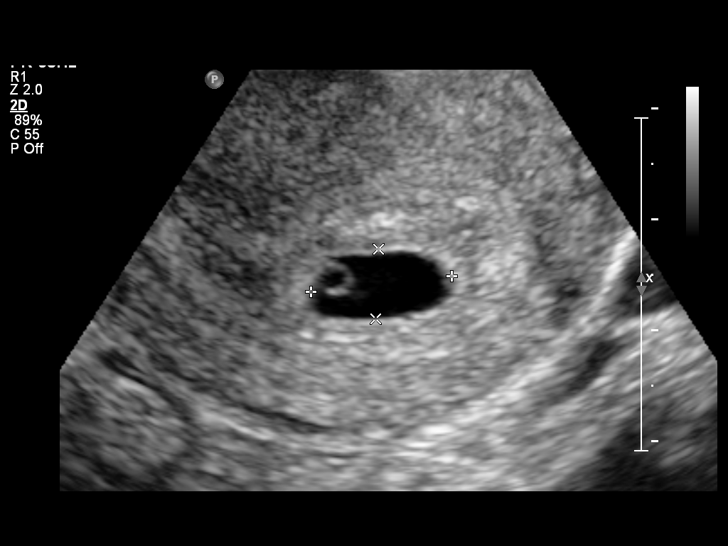
[im 34/44]
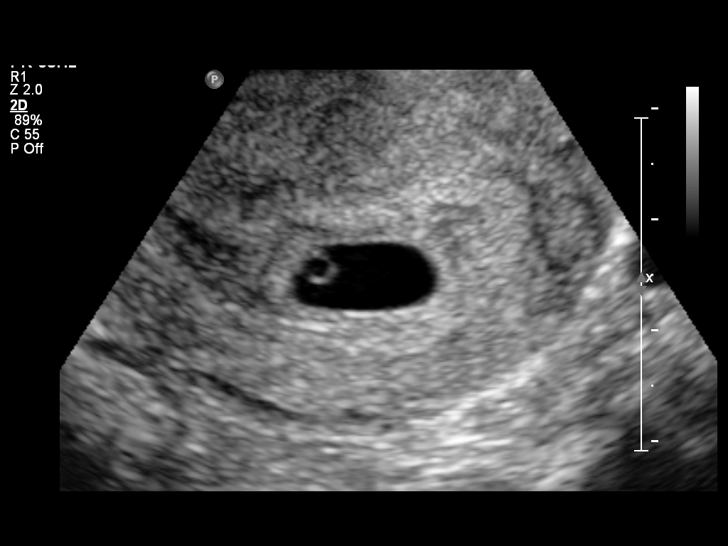
[im 37/44]
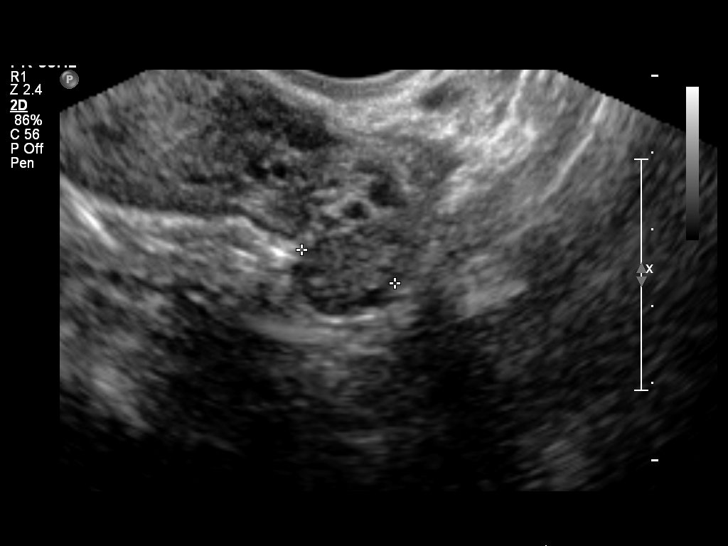
[im 40/44]
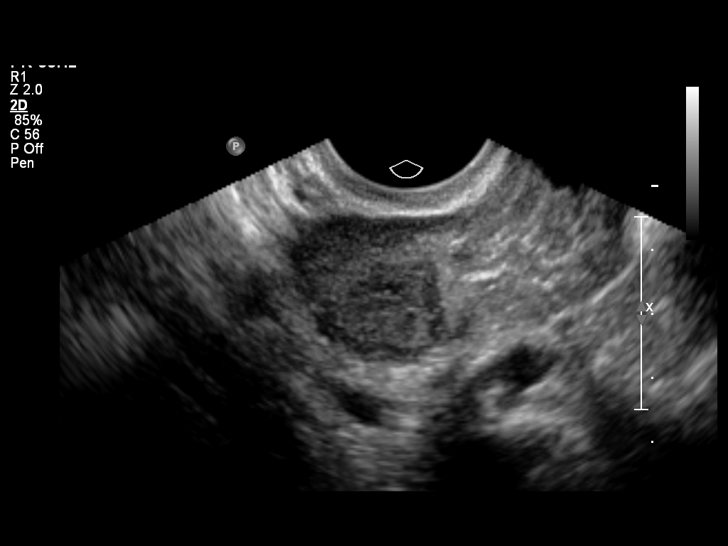
[im 44/44]
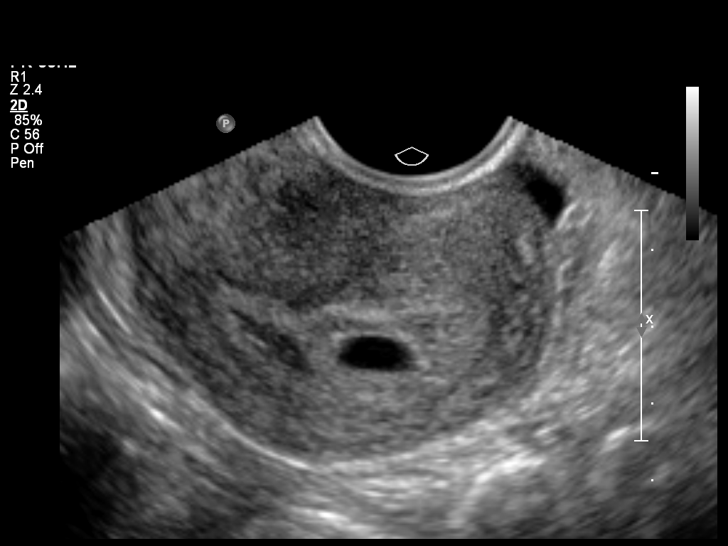

[14 of 28 positions shown; findings below may reference images not displayed]

Intrauterine gestational sac:  Visualized/normal in shape.
Yolk sac: Present
Embryo: Absent
Cardiac Activity: N/A
Heart Rate: N/A bpm

MSD: 9.9 mm      5 w   5 d    EGA         US EDC: 04/29/2013

Maternal uterus/adnexae:
Small subchorionic hemorrhage.
Trace free pelvic fluid.
Ovaries unremarkable.
No adnexal masses.
IMPRESSION: Gestational sac identified within the uterus containing a yolk sac
but no fetal pole is visualized.
Findings are consistent with an intrauterine pregnancy though
unable to establish viability due to nonvisualization of a fetal
pole.
Consider follow-up non emergent ultrasound imaging in 14 days to
evaluate fetal viability.

## 2015-02-18 IMAGING — US US OB DETAIL+14 WK
1 series · 12 of 28 positions shown · non-contrast
Comparison: none

[Series 1: us ob comp +14 wk · 12 of 84 slices shown]
[im 4/84]
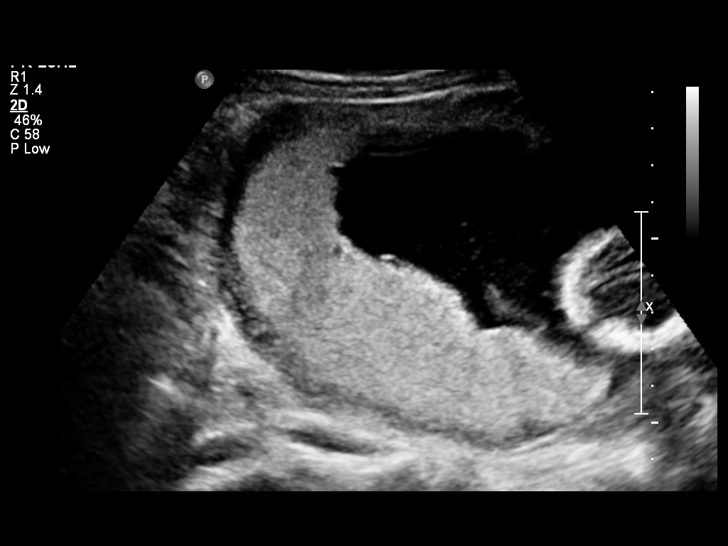
[im 10/84]
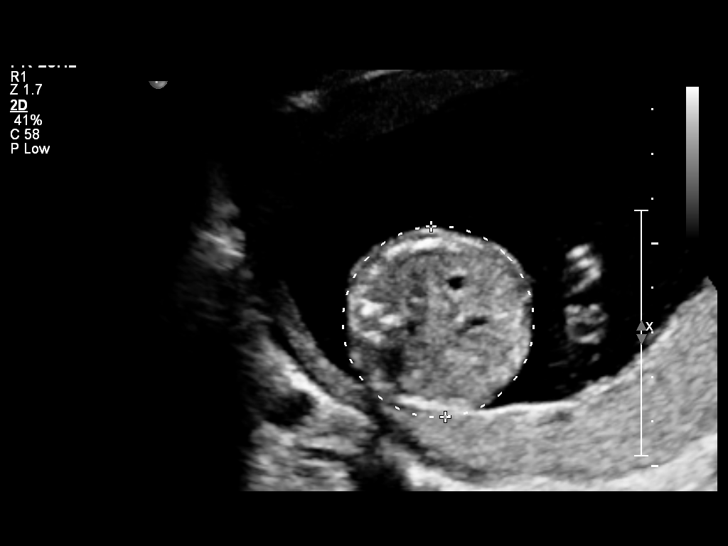
[im 16/84]
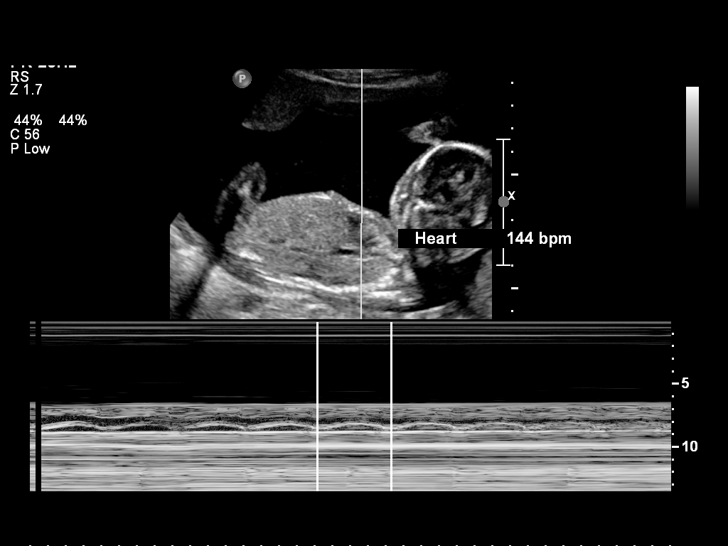
[im 25/84]
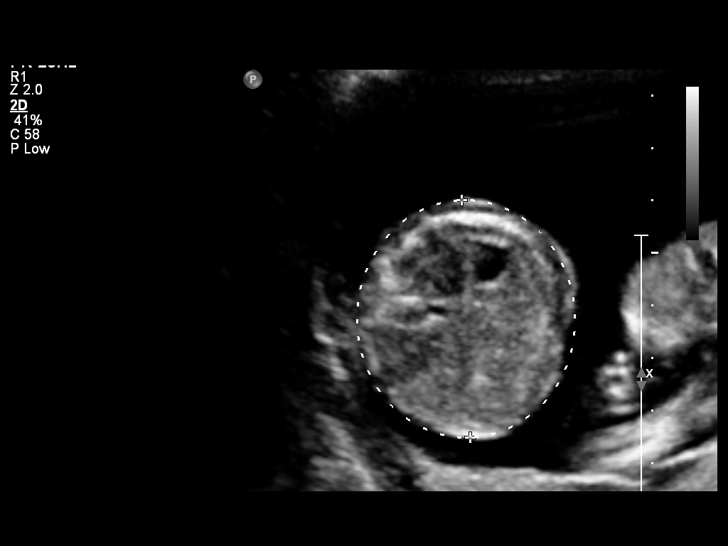
[im 31/84]
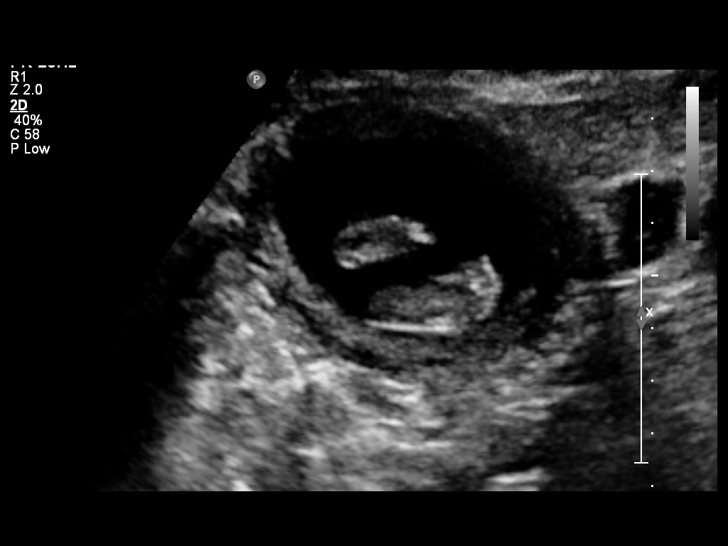
[im 37/84]
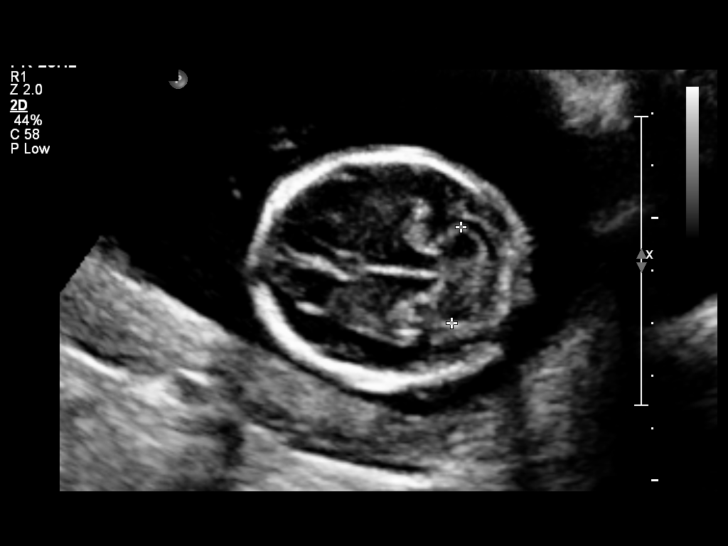
[im 47/84]
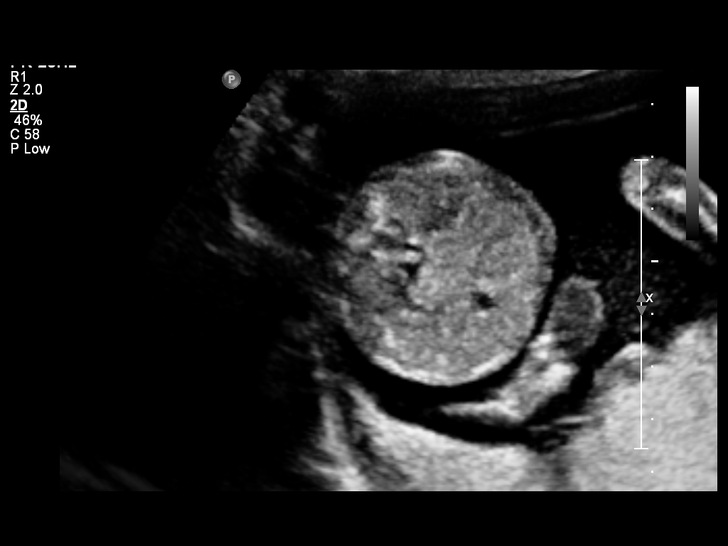
[im 53/84]
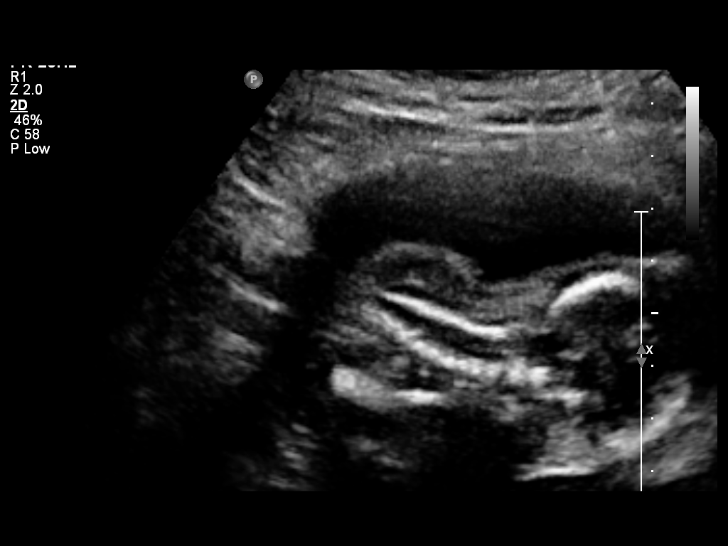
[im 59/84]
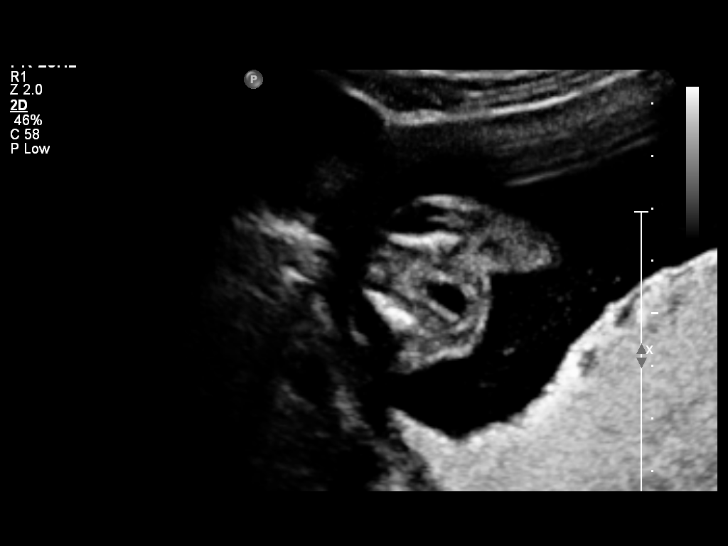
[im 68/84]
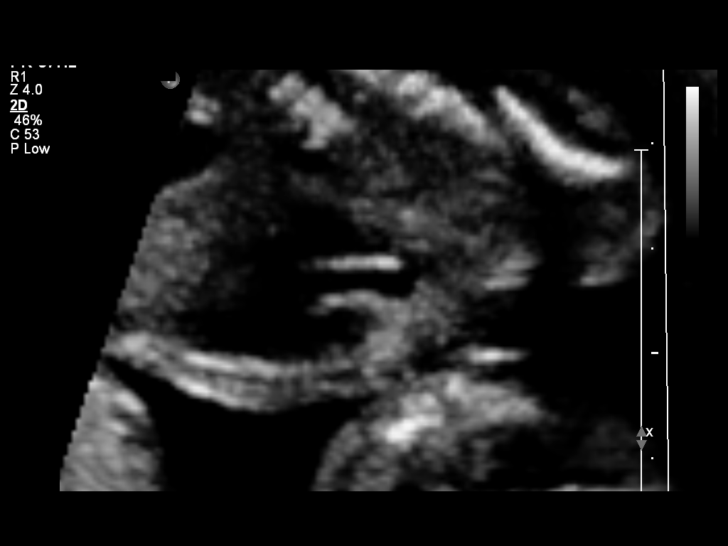
[im 74/84]
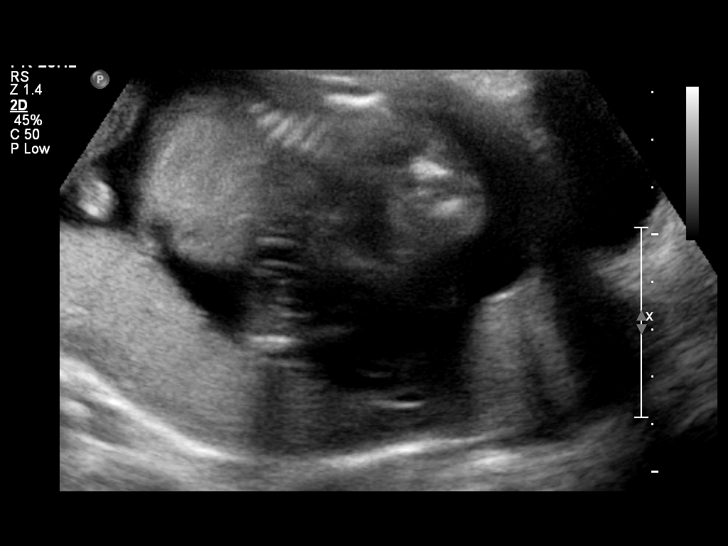
[im 80/84]
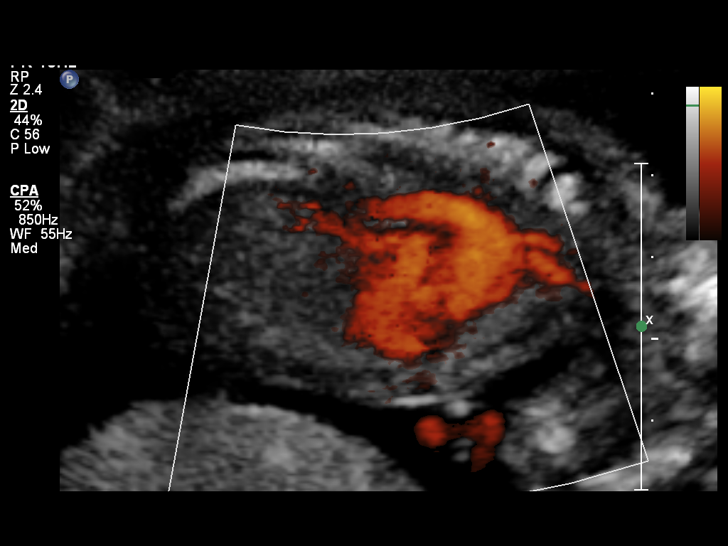

[12 of 28 positions shown; findings below may reference images not displayed]

OBSTETRICS REPORT
                      (Signed Final 11/30/2012 [DATE])

Service(s) Provided

 US OB DETAIL + 14 WK                                  76811.0
Indications

 Detailed fetal anatomic survey
Fetal Evaluation

 Num Of Fetuses:    1
 Fetal Heart Rate:  144                          bpm
 Cardiac Activity:  Observed
 Presentation:      Cephalic
 Placenta:          Posterior, above cervical
                    os
 P. Cord            Visualized, central
 Insertion:

 Amniotic Fluid
 AFI FV:      Subjectively within normal limits
                                             Larg Pckt:    5.67  cm
Biometry

 BPD:     42.1  mm     G. Age:  18w 5d                CI:        75.28   70 - 86
                                                      FL/HC:      16.8   15.8 -
                                                                         18
 HC:     153.9  mm     G. Age:  18w 3d       54  %    HC/AC:      1.14   1.07 -

 AC:     135.1  mm     G. Age:  19w 0d       75  %    FL/BPD:
 FL:      25.9  mm     G. Age:  17w 6d       35  %    FL/AC:      19.2   20 - 24
 HUM:     25.3  mm     G. Age:  18w 0d       49  %
 CER:     18.8  mm     G. Age:  18w 3d       58  %
 NFT:     3.25  mm

 Est. FW:     241  gm      0 lb 9 oz     54  %
Gestational Age

 LMP:           18w 1d        Date:  07/26/12                 EDD:   05/02/13
 U/S Today:     18w 4d                                        EDD:   04/29/13
 Best:          18w 1d     Det. By:  LMP  (07/26/12)          EDD:   05/02/13
Anatomy
 Cranium:          Appears normal         Aortic Arch:      Appears normal
 Fetal Cavum:      Appears normal         Ductal Arch:      Appears normal
 Ventricles:       Appears normal         Diaphragm:        Appears normal
 Choroid Plexus:   Appears normal         Stomach:          Appears normal
 Cerebellum:       Appears normal         Abdomen:          Appears normal
 Posterior Fossa:  Appears normal         Abdominal Wall:   Appears nml (cord
                                                            insert, abd wall)
 Nuchal Fold:      Appears normal         Cord Vessels:     Appears normal (3
                                                            vessel cord)
 Face:             Appears normal         Kidneys:          Appear normal
                   (orbits and profile)
 Lips:             Appears normal         Bladder:          Appears normal
 Heart:            Appears normal         Spine:            Appears normal
                   (4CH, axis, and
                   situs)
 RVOT:             Appears normal         Lower             Appears normal
                                          Extremities:
 LVOT:             Appears normal         Upper             Appears normal
                                          Extremities:

 Other:  Nasal bone visualized. Heels and 5th digit visualized.
Cervix Uterus Adnexa

 Cervical Length:    3.4      cm

 Cervix:       Normal appearance by transabdominal scan.
 Left Ovary:    Previously seen.
 Right Ovary:   Previously seen
Impression

 IUP at 18+1 weeks
 Normal detailed fetal anatomy
 Markers of aneuploidy: none
 Normal amniotic fluid volume
 Measurements consistent with LMP dating
Recommendations

 Follow-up as clinically indicated

 questions or concerns.

## 2015-03-30 IMAGING — US US OB FOLLOW-UP
1 series · 12 of 28 positions shown · non-contrast
Comparison: none

[Series 1: us ob follow up · 12 of 54 slices shown]
[im 2/54]
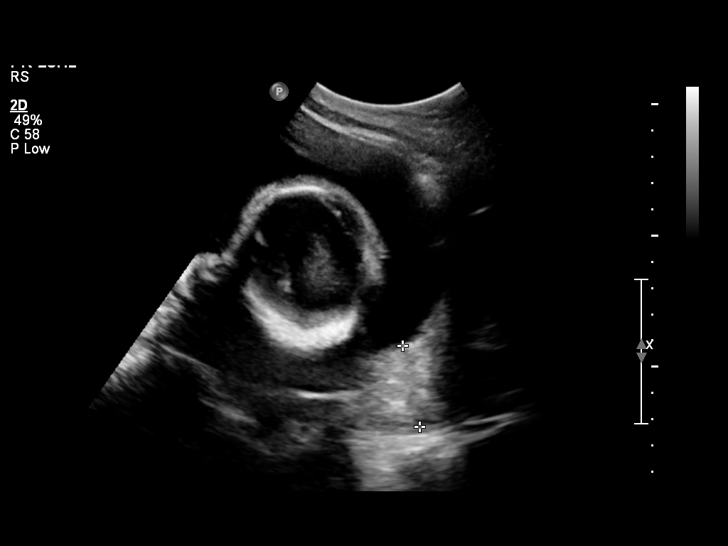
[im 6/54]
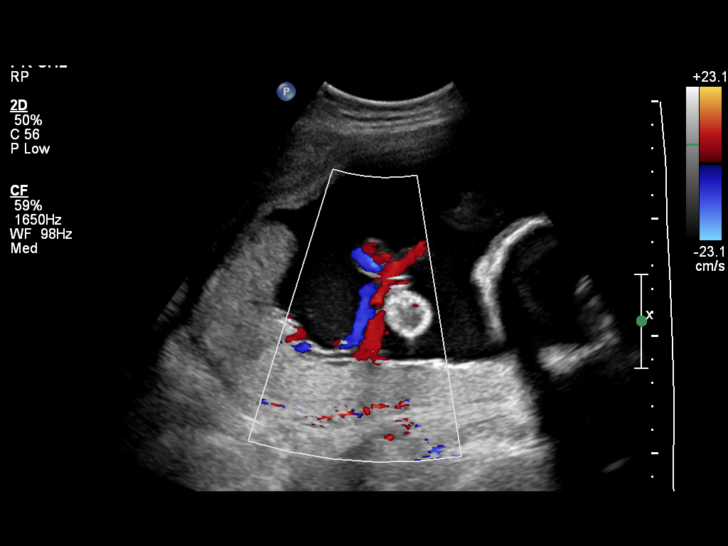
[im 10/54]
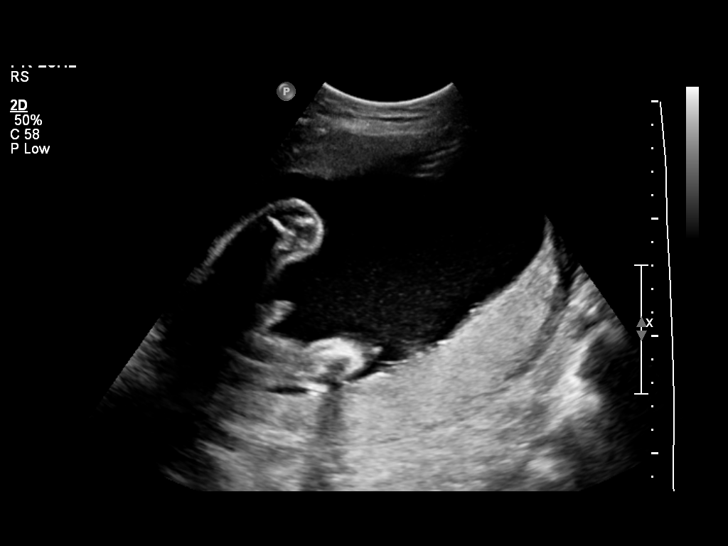
[im 16/54]
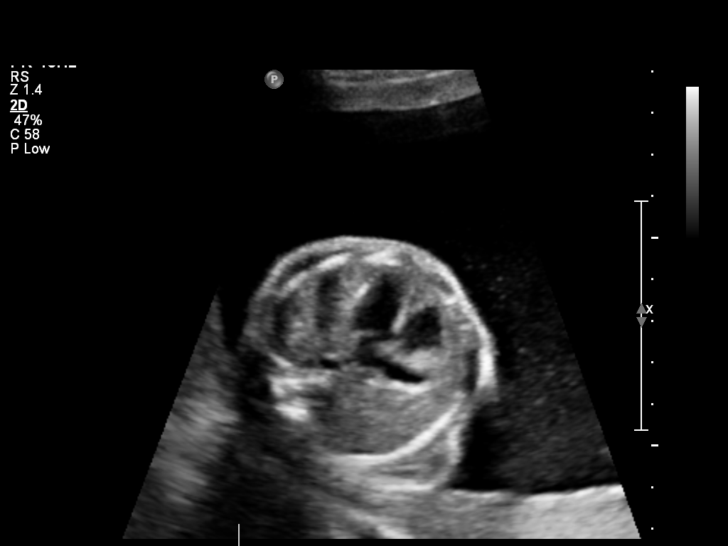
[im 20/54]
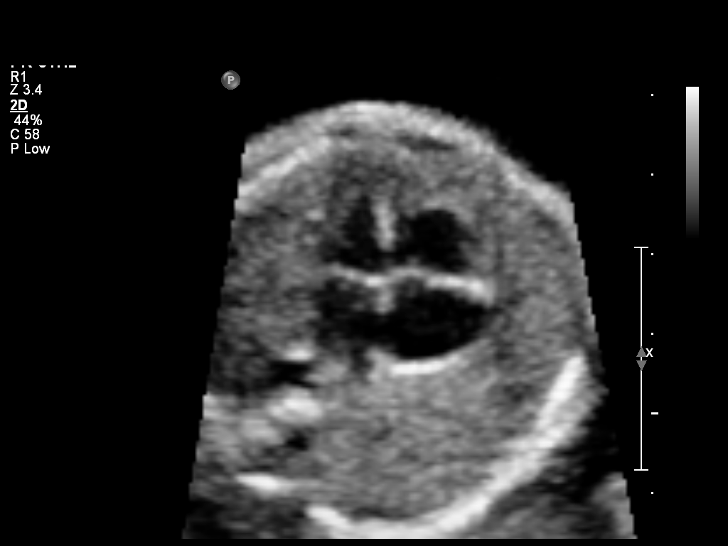
[im 24/54]
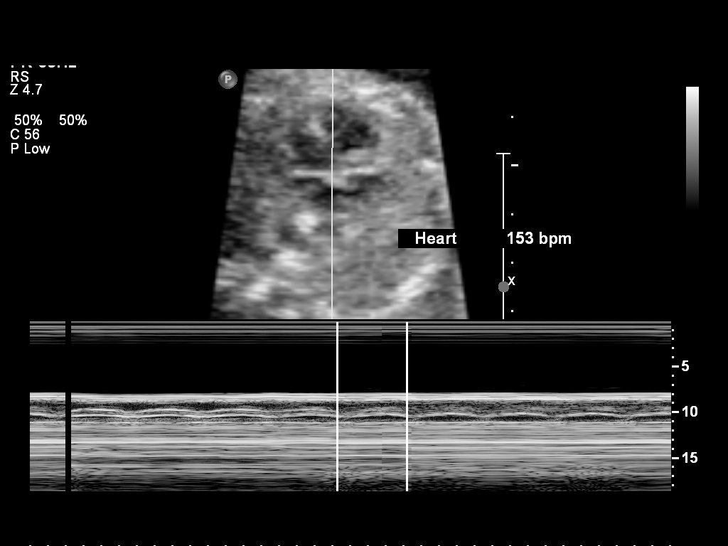
[im 30/54]
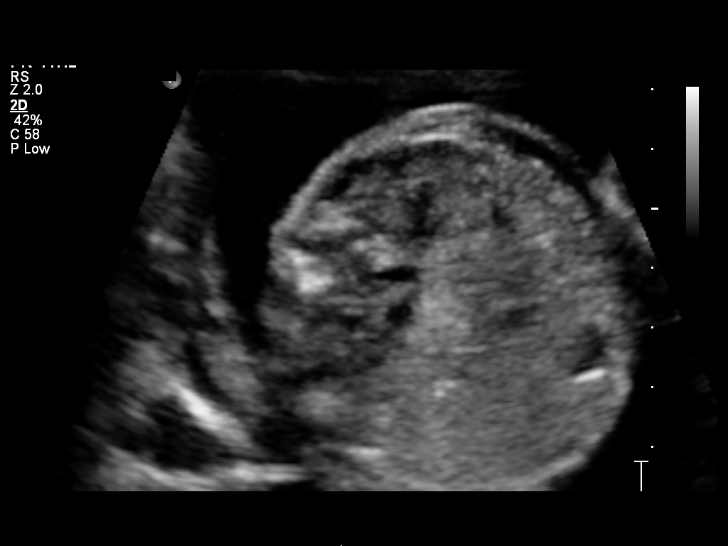
[im 34/54]
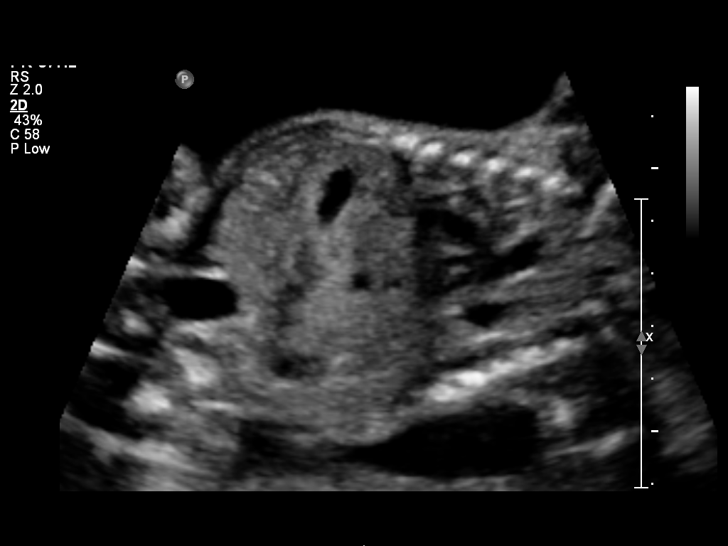
[im 38/54]
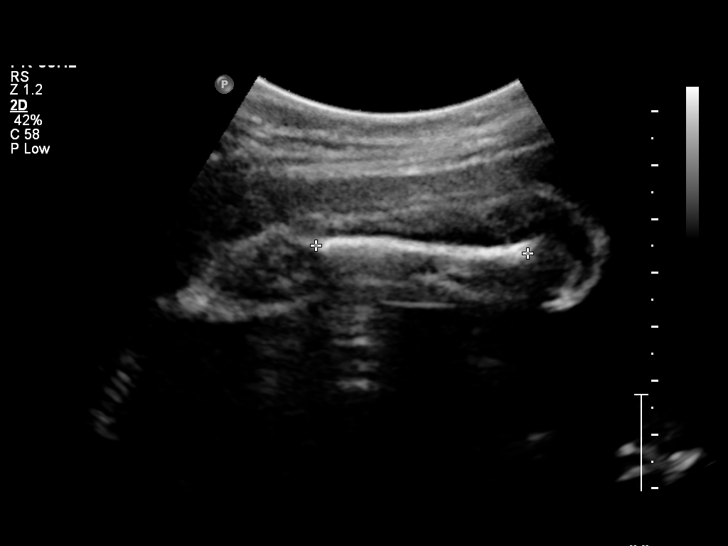
[im 44/54]
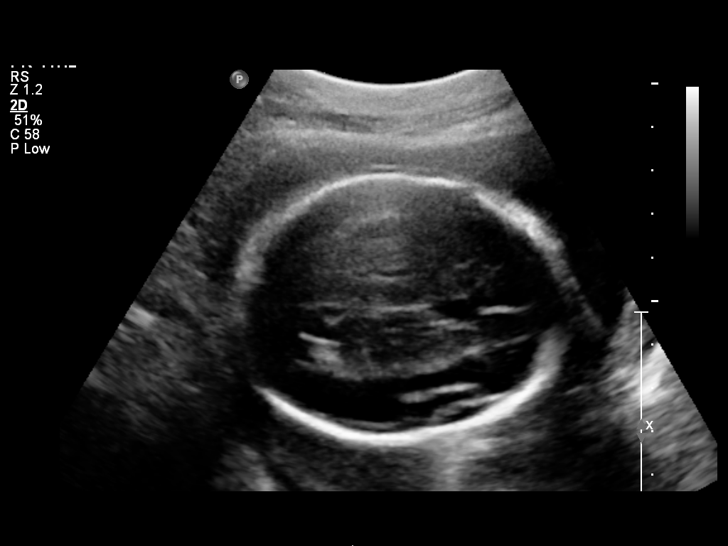
[im 48/54]
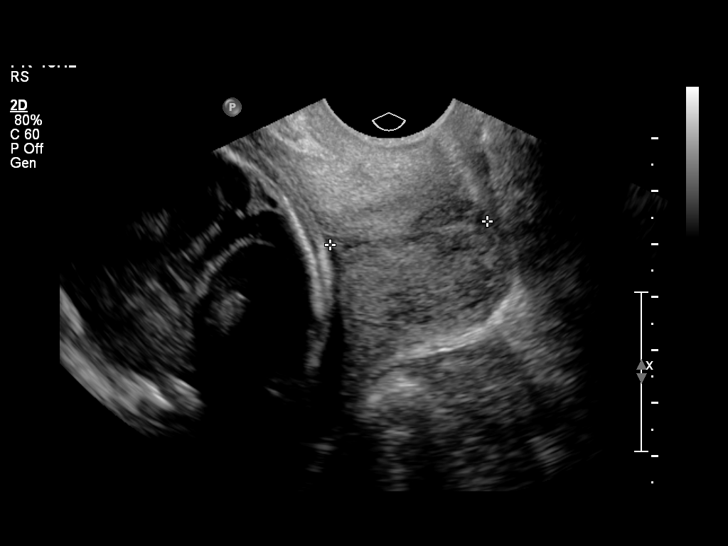
[im 52/54]
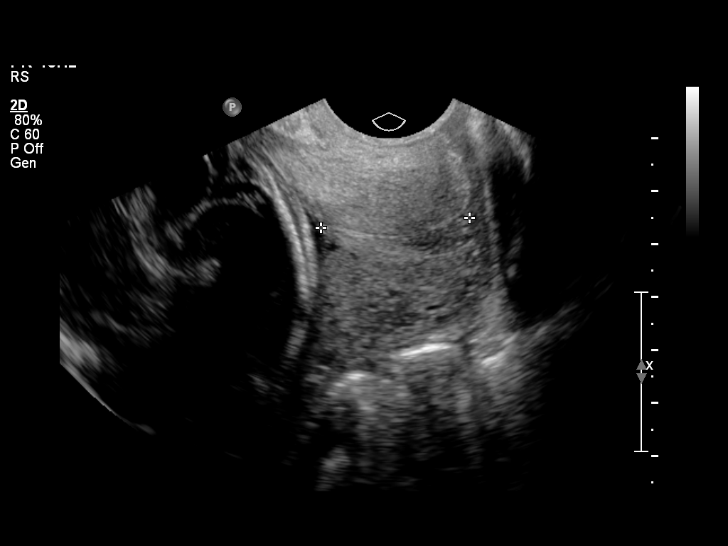

[12 of 28 positions shown; findings below may reference images not displayed]

OBSTETRICS REPORT
                      (Signed Final 01/09/2013 [DATE])

Service(s) Provided

 US OB FOLLOW UP                                       76816.1
Indications

 Preterm labor (> 22 weeks)
Fetal Evaluation

 Num Of Fetuses:    1
 Fetal Heart Rate:  153                          bpm
 Cardiac Activity:  Observed
 Presentation:      Cephalic
 Placenta:          Posterior, above cervical
                    os
 P. Cord            Visualized, central
 Insertion:

 Amniotic Fluid
 AFI FV:      Polyhydramnios
                                              Larg Pckt:   8.69  cm
Biometry

 BPD:     58.9  mm     G. Age:  24w 0d                CI:        71.45   70 - 86
                                                      FL/HC:       18.0  18.7 -

 HC:     221.9  mm     G. Age:  24w 1d       47   %   HC/AC:       1.11  1.05 -

 AC:     199.5  mm     G. Age:  24w 4d       64   %   FL/BPD:      67.9  71 - 87
 FL:        40  mm     G. Age:  22w 6d       13   %   FL/AC:       20.1  20 - 24
 HUM:       39  mm     G. Age:  23w 6d       42   %

 Est. FW:     641   gm     1 lb 7 oz     53  %
Gestational Age

 LMP:           23w 6d        Date:  07/26/12                 EDD:   05/02/13
 U/S Today:     23w 6d                                        EDD:   05/02/13
 Best:          23w 6d     Det. By:  LMP  (07/26/12)          EDD:   05/02/13
Anatomy

 Cranium:          Appears normal         Aortic Arch:      Previously seen
 Fetal Cavum:      Appears normal         Ductal Arch:      Previously seen
 Ventricles:       Appears normal         Diaphragm:        Appears normal
 Choroid Plexus:   Previously seen        Stomach:          Appears normal
 Cerebellum:       Previously seen        Abdomen:          Appears normal
 Posterior Fossa:  Previously seen        Abdominal Wall:   Appears nml (cord
                                                            insert, abd wall)
 Nuchal Fold:      Previously seen        Cord Vessels:     Previously seen
 Face:             Appears normal         Kidneys:          Appear normal
                   (orbits and profile)
 Lips:             Previously seen        Bladder:          Appears normal
 Heart:            Appears normal         Spine:            Previously seen
                   (4CH, axis, and
                   situs)
 RVOT:             Appears normal         Lower             Previously seen
                                          Extremities:
 LVOT:             Appears normal         Upper             Previously seen
                                          Extremities:

 Other:  Male gender. Nasal bone visualized. Heels and 5th digit previously
         visualized.
Cervix Uterus Adnexa

 Cervical Length:    2.74      cm

 Cervix:       Normal appearance by transvaginal scan
 Uterus:       No abnormality visualized.
 Cul De Sac:   No free fluid seen.

 Left Ovary:    Not visualized.
 Right Ovary:   Not visualized.
 Adnexa:     No abnormality visualized.
Impression

 Single IUP at 23 [DATE] weeks
 Normal interval antomy
 Fetal growth is appropriate (53rd %tile)
 Mild polyhydramnios noted with MVP of 8.7 cm

 TVUS - cervical length 2.7 cm without funneling or dynamic
 changes
Recommendations

 Follow up as clinically indicated

 questions or concerns.

## 2015-06-19 IMAGING — US US OB FOLLOW-UP
1 series · 12 of 28 positions shown · non-contrast
Comparison: none

[Series 1: us ob follow-up · 12 of 41 slices shown]
[im 2/41]
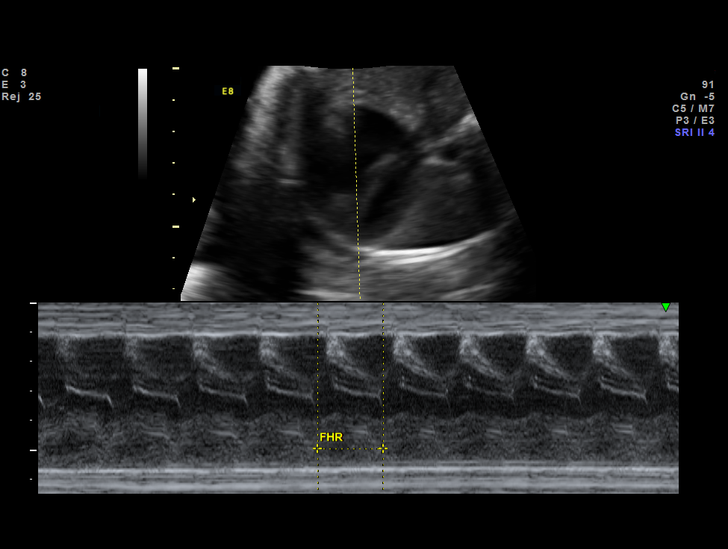
[im 5/41]
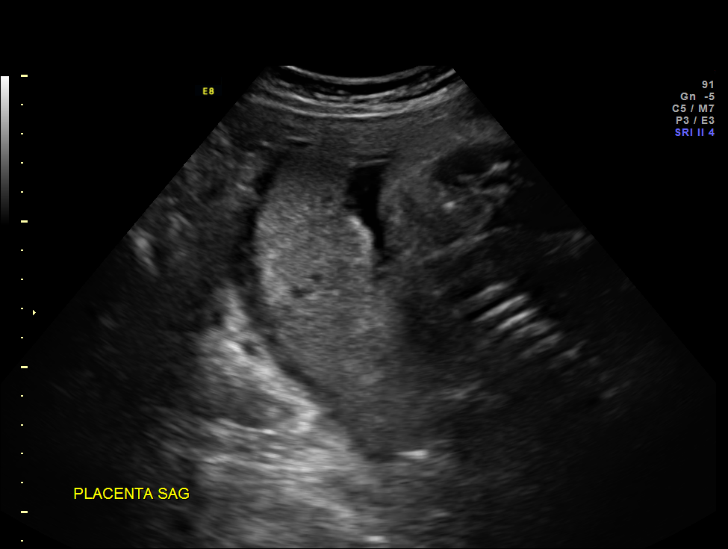
[im 8/41]
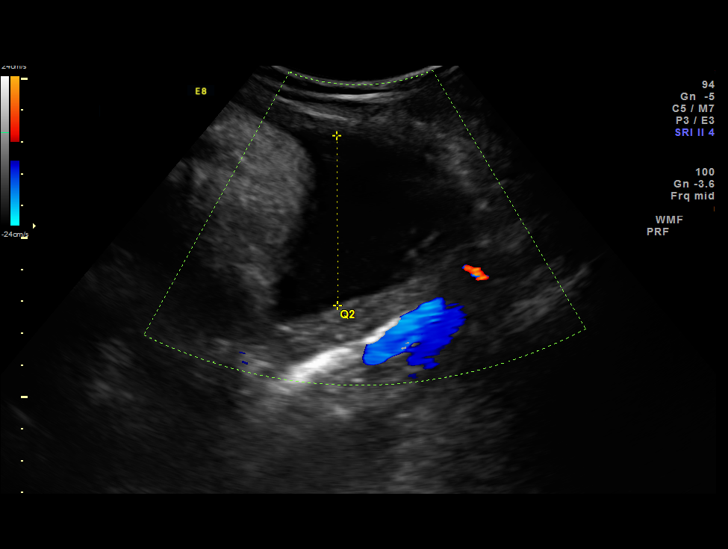
[im 12/41]
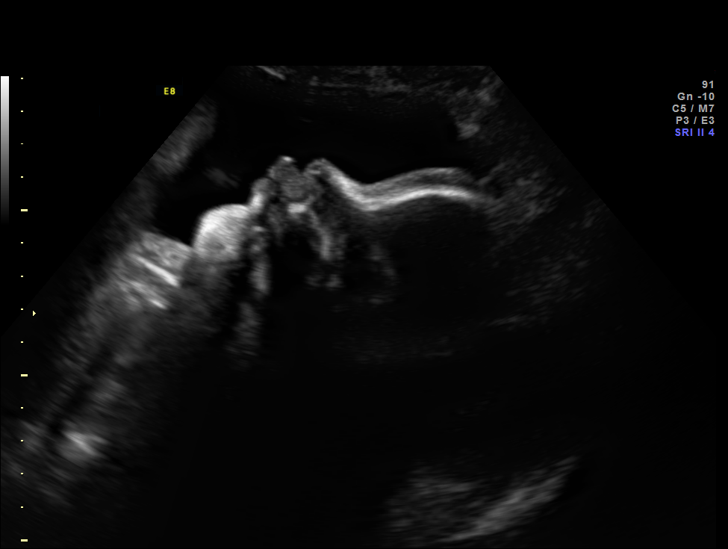
[im 15/41]
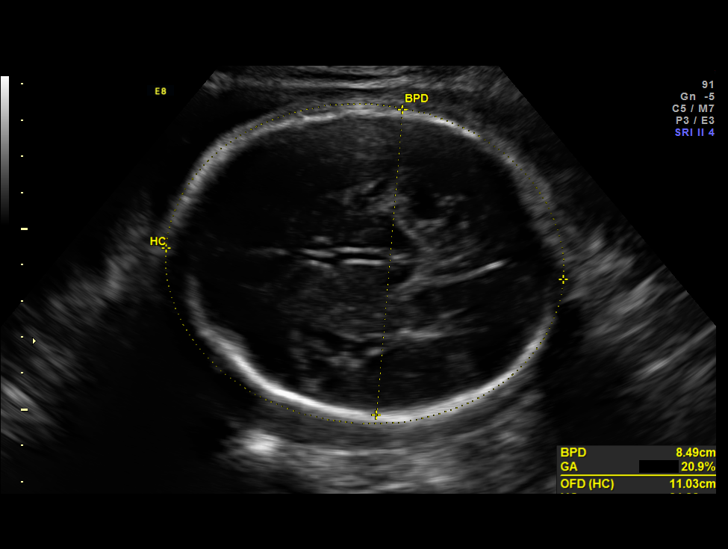
[im 18/41]
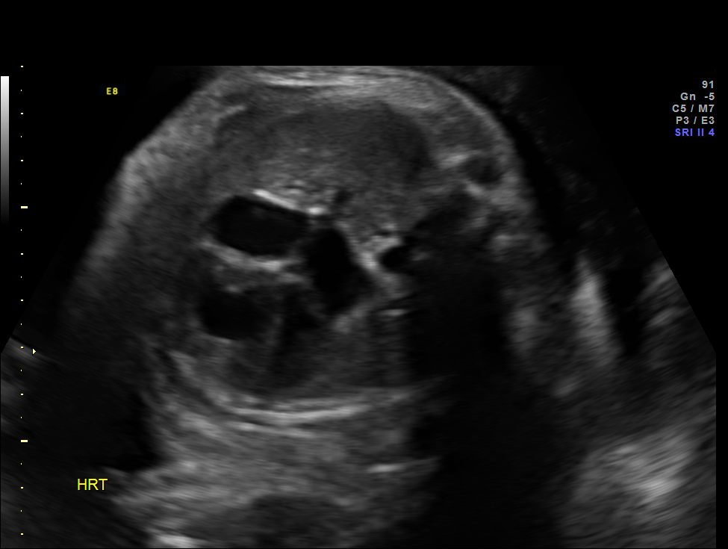
[im 23/41]
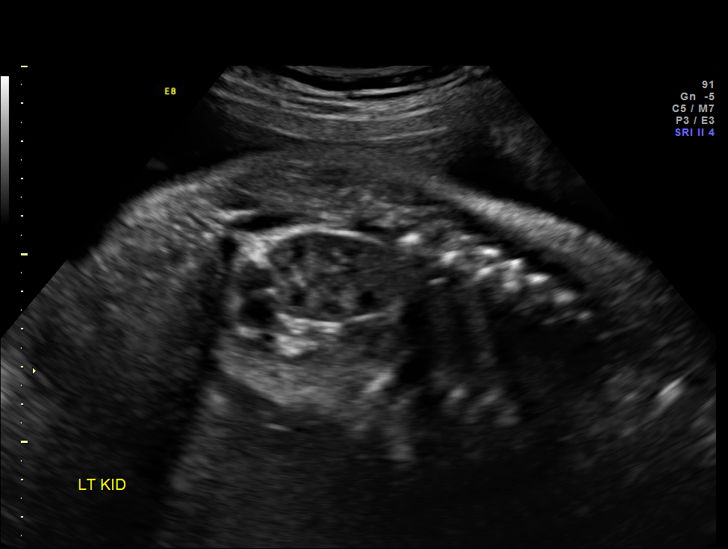
[im 26/41]
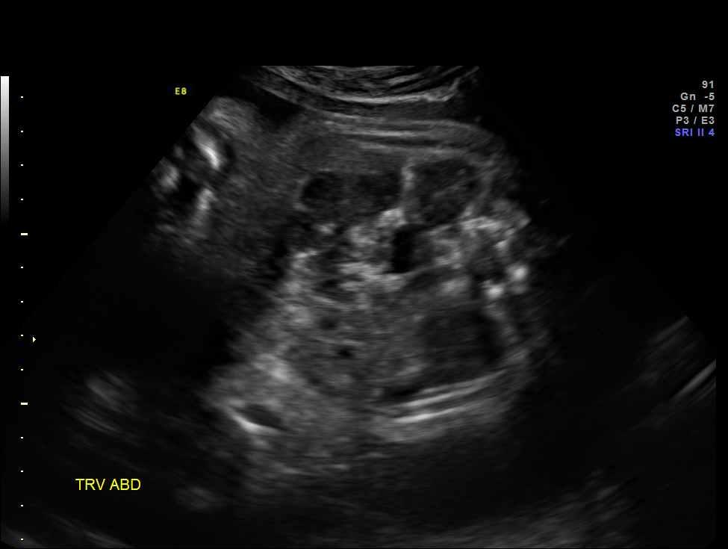
[im 29/41]
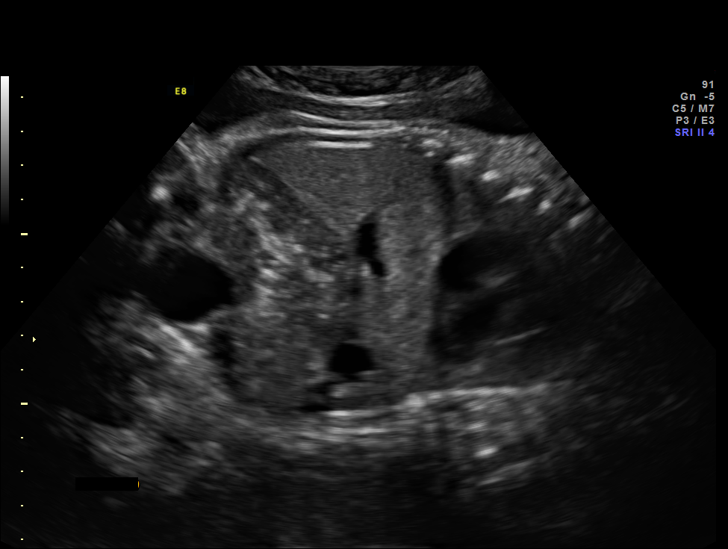
[im 33/41]
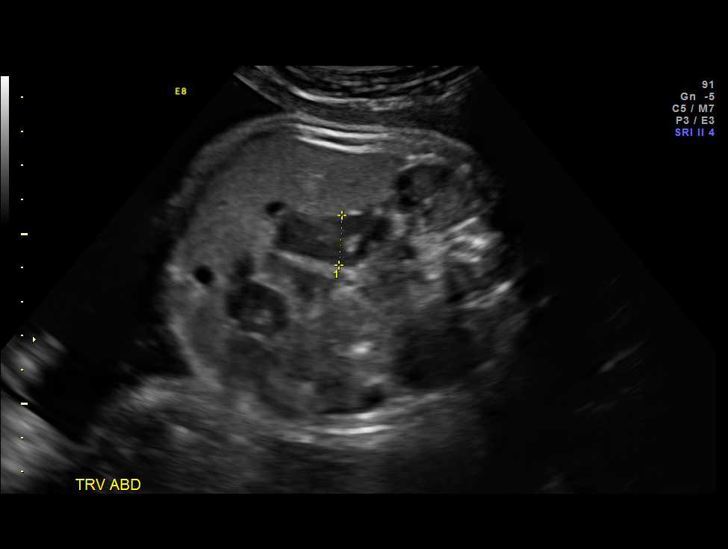
[im 36/41]
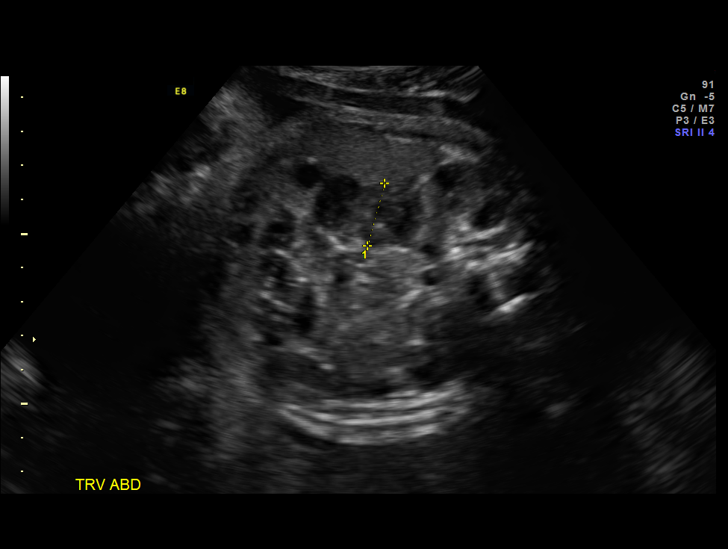
[im 39/41]
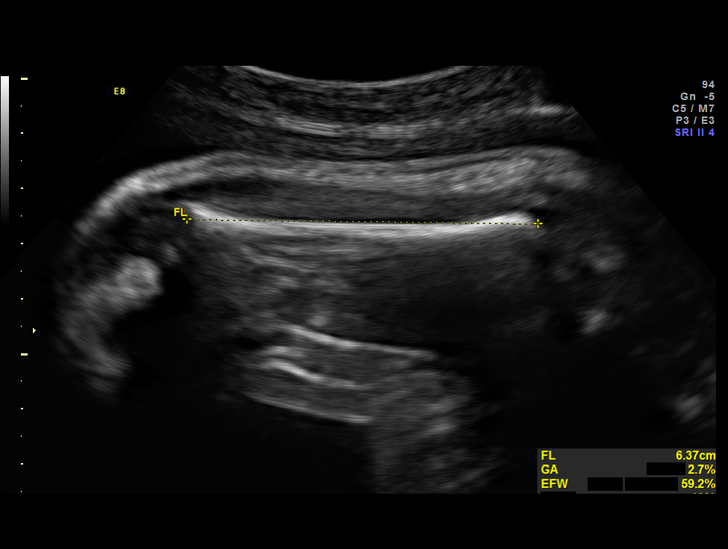

[12 of 28 positions shown; findings below may reference images not displayed]

OBSTETRICS REPORT
                      (Signed Final 03/31/2013 [DATE])

Service(s) Provided

 US OB FOLLOW UP                                       76816.1
Indications

 Diabetes - Gestational, A1 (diet controlled)
 Poor obstetric history: Previous preterm delivery
 (36 weeks)
 Polyhydramnios
Fetal Evaluation

 Num Of Fetuses:    1
 Fetal Heart Rate:  153                          bpm
 Cardiac Activity:  Observed
 Presentation:      Cephalic
 Placenta:          Posterior, above cervical
                    os
 P. Cord            Previously Visualized
 Insertion:

 Amniotic Fluid
 AFI FV:      Polyhydramnios
 AFI Sum:     26.53   cm       96  %Tile     Larg Pckt:    8.89  cm
 RUQ:   4.65    cm   RLQ:    8.89   cm    LUQ:   5.33    cm   LLQ:    7.66   cm
Biometry

 BPD:       85  mm     G. Age:  34w 1d                CI:         76.9   70 - 86
 OFD:    110.6  mm                                    FL/HC:      20.5   20.1 -

 HC:     313.4  mm     G. Age:  35w 1d       14  %    HC/AC:      0.96   0.93 -

 AC:     327.6  mm     G. Age:  36w 5d       87  %    FL/BPD:     75.8   71 - 87
 FL:      64.4  mm     G. Age:  33w 2d        5  %    FL/AC:      19.7   20 - 24
 HUM:     56.3  mm     G. Age:  32w 5d       14  %

 Est. FW:    3993  gm    5 lb 14 oz      61  %
Gestational Age

 LMP:           35w 3d        Date:  07/26/12                 EDD:   05/02/13
 U/S Today:     34w 6d                                        EDD:   05/06/13
 Best:          35w 3d     Det. By:  LMP  (07/26/12)          EDD:   05/02/13
Anatomy

 Cranium:          Appears normal         Aortic Arch:      Previously seen
 Fetal Cavum:      Appears normal         Ductal Arch:      Previously seen
 Ventricles:       Appears normal         Diaphragm:        Previously seen
 Choroid Plexus:   Previously seen        Stomach:          Appears normal
 Cerebellum:       Previously seen        Abdomen:          Dilated bowel
 Posterior Fossa:  Previously seen        Abdominal Wall:   Previously seen
 Nuchal Fold:      Previously seen        Cord Vessels:     Previously seen
 Face:             Orbits and profile     Kidneys:          Appear normal
                   previously seen
 Lips:             Previously seen        Bladder:          Appears normal
 Heart:            Appears normal         Spine:            Previously seen
                   (4CH, axis, and
                   situs)
 RVOT:             Previously seen        Lower             Previously seen
                                          Extremities:
 LVOT:             Previously seen        Upper             Previously seen
                                          Extremities:

 Other:  Male gender. Nasal bone previously visualized. Heels and 5th digit
         previously visualized.
Cervix Uterus Adnexa

 Cervix:       Not visualized (advanced GA >44wks)
Impression

 Single IUP at 35 [DATE] weeks
 Interval growth is appropriate (61st%'ile)
 Posterior placenta without previa
 Today's images indicate some loops of small/large bowel that
 are dilated with heterogenous material that is technically not
 echogenic (not bright as bone) in the bowel lumen (in setting
 of recent fall this finding may represent swallowed blood)
 Polyhydramnios
Recommendations

 1. Today's findings and impressions were discussed with the
 patient at bedside.  All questions were answered.  Dilated
 bowel is likely a normal variant and unlikely to represent
 bowel obstruction but follow up is warranted.  If
 worsening/persistence is noted, postnatal pediatric evaluation
 will be warranted.

 2. Continue antenatal testing (NST 2x/wk and weekly AFI) in
 setting of polyhydramnios.

 3. Fetal Fredrik Johansen.

 questions or concerns.
